# Patient Record
Sex: Male | Born: 1960 | Race: White | Hispanic: No | Marital: Married | Smoking: Never smoker
Health system: Southern US, Community
[De-identification: ages and names within clinical notes are randomized; demographics above are authoritative.]

## PROBLEM LIST (undated history)

## (undated) DIAGNOSIS — E119 Type 2 diabetes mellitus without complications: Secondary | ICD-10-CM

## (undated) DIAGNOSIS — K859 Acute pancreatitis without necrosis or infection, unspecified: Secondary | ICD-10-CM

## (undated) DIAGNOSIS — Z9889 Other specified postprocedural states: Secondary | ICD-10-CM

## (undated) DIAGNOSIS — E7800 Pure hypercholesterolemia, unspecified: Secondary | ICD-10-CM

## (undated) DIAGNOSIS — R112 Nausea with vomiting, unspecified: Secondary | ICD-10-CM

## (undated) DIAGNOSIS — Z87442 Personal history of urinary calculi: Secondary | ICD-10-CM

## (undated) DIAGNOSIS — I10 Essential (primary) hypertension: Secondary | ICD-10-CM

## (undated) DIAGNOSIS — K219 Gastro-esophageal reflux disease without esophagitis: Secondary | ICD-10-CM

## (undated) HISTORY — PX: AMPUTATION: SHX166

## (undated) HISTORY — PX: OTHER SURGICAL HISTORY: SHX169

---

## 2008-02-05 ENCOUNTER — Emergency Department (HOSPITAL_BASED_OUTPATIENT_CLINIC_OR_DEPARTMENT_OTHER): Admission: EM | Admit: 2008-02-05 | Discharge: 2008-02-05 | Payer: Self-pay | Admitting: Emergency Medicine

## 2011-04-20 LAB — DIFFERENTIAL
Basophils Absolute: 0.1
Lymphs Abs: 0.8
Monocytes Absolute: 1.3 — ABNORMAL HIGH

## 2011-04-20 LAB — BASIC METABOLIC PANEL
BUN: 8
Chloride: 105
GFR calc non Af Amer: >60
Glucose, Bld: 138 — ABNORMAL HIGH
Potassium: 4.4

## 2011-04-20 LAB — CBC
HCT: 47.1
MCV: 90.7
Platelets: DECREASED
RDW: 12.7

## 2011-07-24 DIAGNOSIS — Z87442 Personal history of urinary calculi: Secondary | ICD-10-CM

## 2011-07-24 HISTORY — DX: Personal history of urinary calculi: Z87.442

## 2011-07-24 HISTORY — PX: EXTRACORPOREAL SHOCK WAVE LITHOTRIPSY: SHX1557

## 2013-11-20 HISTORY — PX: OTHER SURGICAL HISTORY: SHX169

## 2015-12-28 DIAGNOSIS — E1140 Type 2 diabetes mellitus with diabetic neuropathy, unspecified: Secondary | ICD-10-CM | POA: Insufficient documentation

## 2015-12-28 DIAGNOSIS — E1169 Type 2 diabetes mellitus with other specified complication: Secondary | ICD-10-CM | POA: Insufficient documentation

## 2016-01-06 DIAGNOSIS — K610 Anal abscess: Secondary | ICD-10-CM | POA: Insufficient documentation

## 2016-02-08 ENCOUNTER — Other Ambulatory Visit: Payer: Self-pay | Admitting: Gastroenterology

## 2016-02-17 ENCOUNTER — Encounter (HOSPITAL_COMMUNITY): Payer: Self-pay | Admitting: *Deleted

## 2016-02-20 ENCOUNTER — Ambulatory Visit (HOSPITAL_COMMUNITY): Payer: 59 | Admitting: Anesthesiology

## 2016-02-20 ENCOUNTER — Ambulatory Visit (HOSPITAL_COMMUNITY)
Admission: RE | Admit: 2016-02-20 | Discharge: 2016-02-20 | Disposition: A | Discharge status: Home / Self Care | Payer: 59 | Source: Ambulatory Visit | Attending: Gastroenterology | Admitting: Gastroenterology

## 2016-02-20 ENCOUNTER — Encounter (HOSPITAL_COMMUNITY): Payer: Self-pay | Admitting: *Deleted

## 2016-02-20 ENCOUNTER — Encounter (HOSPITAL_COMMUNITY)
Admission: RE | Disposition: A | Discharge status: Home / Self Care | Payer: Self-pay | Source: Ambulatory Visit | Attending: Gastroenterology

## 2016-02-20 DIAGNOSIS — Z794 Long term (current) use of insulin: Secondary | ICD-10-CM | POA: Insufficient documentation

## 2016-02-20 DIAGNOSIS — Z79899 Other long term (current) drug therapy: Secondary | ICD-10-CM | POA: Insufficient documentation

## 2016-02-20 DIAGNOSIS — D125 Benign neoplasm of sigmoid colon: Secondary | ICD-10-CM | POA: Diagnosis not present

## 2016-02-20 DIAGNOSIS — E7800 Pure hypercholesterolemia, unspecified: Secondary | ICD-10-CM | POA: Diagnosis not present

## 2016-02-20 DIAGNOSIS — I10 Essential (primary) hypertension: Secondary | ICD-10-CM | POA: Insufficient documentation

## 2016-02-20 DIAGNOSIS — E119 Type 2 diabetes mellitus without complications: Secondary | ICD-10-CM | POA: Insufficient documentation

## 2016-02-20 DIAGNOSIS — Z1211 Encounter for screening for malignant neoplasm of colon: Secondary | ICD-10-CM | POA: Diagnosis present

## 2016-02-20 HISTORY — DX: Essential (primary) hypertension: I10

## 2016-02-20 HISTORY — DX: Gastro-esophageal reflux disease without esophagitis: K21.9

## 2016-02-20 HISTORY — DX: Pure hypercholesterolemia, unspecified: E78.00

## 2016-02-20 HISTORY — DX: Nausea with vomiting, unspecified: R11.2

## 2016-02-20 HISTORY — DX: Type 2 diabetes mellitus without complications: E11.9

## 2016-02-20 HISTORY — DX: Other specified postprocedural states: Z98.890

## 2016-02-20 HISTORY — PX: COLONOSCOPY WITH PROPOFOL: SHX5780

## 2016-02-20 HISTORY — DX: Acute pancreatitis without necrosis or infection, unspecified: K85.90

## 2016-02-20 HISTORY — DX: Personal history of urinary calculi: Z87.442

## 2016-02-20 LAB — GLUCOSE, CAPILLARY: Glucose-Capillary: 238 mg/dL — ABNORMAL HIGH (ref 65–99)

## 2016-02-20 SURGERY — COLONOSCOPY WITH PROPOFOL
Anesthesia: Monitor Anesthesia Care

## 2016-02-20 MED ORDER — PROPOFOL 10 MG/ML IV BOLUS
INTRAVENOUS | Status: AC
  Filled 2016-02-20: qty 60

## 2016-02-20 MED ORDER — LACTATED RINGERS IV SOLN
INTRAVENOUS | Status: DC
  Administered 2016-02-20: 13:00:00 via INTRAVENOUS

## 2016-02-20 MED ORDER — LIDOCAINE HCL (CARDIAC) 20 MG/ML IV SOLN
INTRAVENOUS | Status: DC | PRN
  Administered 2016-02-20: 25 mg via INTRATRACHEAL

## 2016-02-20 MED ORDER — PROPOFOL 10 MG/ML IV BOLUS
INTRAVENOUS | Status: DC | PRN
  Administered 2016-02-20 (×2): 30 mg via INTRAVENOUS

## 2016-02-20 MED ORDER — PROPOFOL 500 MG/50ML IV EMUL
INTRAVENOUS | Status: DC | PRN
  Administered 2016-02-20: 150 ug/kg/min via INTRAVENOUS

## 2016-02-20 MED ORDER — SODIUM CHLORIDE 0.9 % IV SOLN: INTRAVENOUS | Status: DC

## 2016-02-20 SURGICAL SUPPLY — 22 items

## 2016-02-20 NOTE — Discharge Instructions (Signed)

## 2016-02-20 NOTE — Op Note (Signed)
[facility] Patient Name: Kyle [name] Procedure Date: 02/20/2016 MRN: [id number] Attending MD: Garlan Fair , MD Date of Birth: 1961-07-16 CSN: [id number] Age: 55 Admit Type: Outpatient Procedure:                Colonoscopy Indications:              Screening for colorectal malignant neoplasm Providers:                Garlan Fair, MD, Laverta Baltimore RN, RN, Alfonso Patten, Technician, Dione Booze, CRNA Referring MD:              Medicines:                Propofol per Anesthesia Complications:            No immediate complications. Estimated Blood Loss:     Estimated blood loss: none. Procedure:                Pre-Anesthesia Assessment:                           - Prior to the procedure, a History and Physical                            was performed, and patient medications and                            allergies were reviewed. The patient's tolerance of                            previous anesthesia was also reviewed. The risks                            and benefits of the procedure and the sedation                            options and risks were discussed with the patient.                            All questions were answered, and informed consent                            was obtained. Prior Anticoagulants: The patient has                            taken no previous anticoagulant or antiplatelet                            agents. ASA Grade Assessment: II - A patient with                            mild systemic disease. After reviewing the risks  and benefits, the patient was deemed in                            satisfactory condition to undergo the procedure.                           After obtaining informed consent, the colonoscope                            was passed under direct vision. Throughout the                            procedure, the patient's blood pressure, pulse, and                          oxygen saturations were monitored continuously. The                            [id number] scope was introduced through                            the anus and advanced to the the cecum, identified                            by appendiceal orifice and ileocecal valve. The                            colonoscopy was performed without difficulty. The                            patient tolerated the procedure well. The quality                            of the bowel preparation was good. The ileocecal                            valve, the appendiceal orifice and the rectum were                            photographed. Scope In: 1:10:12 PM Scope Out: 1:34:19 PM Total Procedure Duration: 0 hours 24 minutes 7 seconds  Findings:      The perianal and digital rectal examinations were normal.      A 5 mm polyp was found in the mid sigmoid colon. The polyp was sessile.       The polyp was removed with a cold snare. Resection and retrieval were       complete.      The exam was otherwise without abnormality. Impression:               - One 5 mm polyp in the mid sigmoid colon, removed                            with a cold snare. Resected and retrieved.                           -  The examination was otherwise normal. Moderate Sedation:      N/A- Per Anesthesia Care Recommendation:           - Patient has a contact number available for                            emergencies. The signs and symptoms of potential                            delayed complications were discussed with the                            patient. Return to normal activities tomorrow.                            Written discharge instructions were provided to the                            patient.                           - Repeat colonoscopy date to be determined after                            pending pathology results are reviewed for                            surveillance.                            - Resume previous diet.                           - Continue present medications. Procedure Code(s):        --- Professional ---                           [phone], Colonoscopy, flexible; with removal of                            tumor(s), polyp(s), or other lesion(s) by snare                            technique Diagnosis Code(s):        --- Professional ---                           Z12.11, Encounter for screening for malignant                            neoplasm of colon                           D12.5, Benign neoplasm of sigmoid colon CPT copyright 2016 [employer]. All rights reserved. The codes documented in this report are preliminary and upon coder review may  be revised to meet current compliance requirements. Earle Gell, MD Ursula Alert  Wynetta Emery, MD 02/20/2016 1:42:45 PM This report has been signed electronically. Number of Addenda: 0

## 2016-02-20 NOTE — H&P (Signed)
  Procedure: Baseline screening colonoscopy  History: The patient is a 55 year old male born Jan 01, 1961. He is scheduled to undergo a screening colonoscopy today  Medication allergies: None  Past medical history: Type 2 diabetes mellitus. Hypertension. Hypercholesterolemia.  Exam: The patient is alert and lying comfortably on the endoscopy stretcher. Abdomen is soft and nontender to palpation. Cardiac exam reveals a regular rhythm. Lungs are clear to auscultation.  Plan: Proceed with screening colonoscopy

## 2016-02-20 NOTE — Transfer of Care (Signed)
Immediate Anesthesia Transfer of Care Note  Patient: Kyle [name]  Procedure(s) Performed: Procedure(s): COLONOSCOPY WITH PROPOFOL (N/A)  Patient Location: PACU and Endoscopy Unit  Anesthesia Type:MAC  Level of Consciousness: awake and patient cooperative  Airway & Oxygen Therapy: Patient Spontanous Breathing and Patient connected to face mask oxygen  Post-op Assessment: Report given to RN and Post -op Vital signs reviewed and stable  Post vital signs: Reviewed and stable  Last Vitals:  Vitals:   02/20/16 1235  BP: (!) 169/105  Pulse: 89  Resp: 20  Temp: 36.8 C    Last Pain:  Vitals:   02/20/16 1235  TempSrc: Oral         Complications: No apparent anesthesia complications

## 2016-02-20 NOTE — Anesthesia Preprocedure Evaluation (Signed)
Anesthesia Evaluation  Patient identified by MRN, date of birth, ID band Patient awake    Reviewed: Allergy & Precautions, H&P , Patient's Chart, lab work & pertinent test results, reviewed documented beta blocker date and time   Airway Mallampati: II  TM Distance: >3 FB Neck ROM: full    Dental no notable dental hx.    Pulmonary    Pulmonary exam normal breath sounds clear to auscultation       Cardiovascular hypertension, On Medications  Rhythm:regular Rate:Normal     Neuro/Psych    GI/Hepatic GERD  ,  Endo/Other  diabetes, Insulin Dependent  Renal/GU      Musculoskeletal   Abdominal   Peds  Hematology   Anesthesia Other Findings   Reproductive/Obstetrics                             Anesthesia Physical Anesthesia Plan  ASA: II  Anesthesia Plan: MAC   Post-op Pain Management:    Induction: Intravenous  Airway Management Planned: Mask and Natural Airway  Additional Equipment:   Intra-op Plan:   Post-operative Plan:   Informed Consent: I have reviewed the patients History and Physical, chart, labs and discussed the procedure including the risks, benefits and alternatives for the proposed anesthesia with the patient or authorized representative who has indicated his/her understanding and acceptance.   Dental Advisory Given  Plan Discussed with: CRNA and Surgeon  Anesthesia Plan Comments: (Discussed sedation and potential to need to place airway or ETT if warranted by clinical changes intra-operatively. We will start procedure as MAC.)        Anesthesia Quick Evaluation

## 2016-02-21 ENCOUNTER — Encounter (HOSPITAL_COMMUNITY): Payer: Self-pay | Admitting: Gastroenterology

## 2016-02-23 NOTE — Anesthesia Postprocedure Evaluation (Signed)
Anesthesia Post Note  Patient: Kyle [name]  Procedure(s) Performed: Procedure(s) (LRB): COLONOSCOPY WITH PROPOFOL (N/A)  Patient location during evaluation: PACU Anesthesia Type: MAC Level of consciousness: awake and alert Pain management: pain level controlled Vital Signs Assessment: post-procedure vital signs reviewed and stable Respiratory status: spontaneous breathing, nonlabored ventilation, respiratory function stable and patient connected to nasal cannula oxygen Cardiovascular status: stable and blood pressure returned to baseline Anesthetic complications: no    Last Vitals:  Vitals:   02/20/16 1340 02/20/16 1400  BP: (!) 166/101 (!) 186/115  Pulse:    Resp:    Temp: 36.6 C     Last Pain:  Vitals:   02/20/16 1340  TempSrc: Oral                 Treson Laura EDWARD

## 2016-04-24 ENCOUNTER — Other Ambulatory Visit: Payer: Self-pay | Admitting: Physician Assistant

## 2016-04-24 DIAGNOSIS — E113591 Type 2 diabetes mellitus with proliferative diabetic retinopathy without macular edema, right eye: Secondary | ICD-10-CM

## 2016-04-24 DIAGNOSIS — H2013 Chronic iridocyclitis, bilateral: Secondary | ICD-10-CM

## 2016-05-03 ENCOUNTER — Ambulatory Visit
Admission: RE | Admit: 2016-05-03 | Discharge: 2016-05-03 | Disposition: A | Discharge status: Home / Self Care | Payer: 59 | Source: Ambulatory Visit | Attending: Physician Assistant | Admitting: Physician Assistant

## 2016-05-03 DIAGNOSIS — H2013 Chronic iridocyclitis, bilateral: Secondary | ICD-10-CM

## 2016-05-03 DIAGNOSIS — E113591 Type 2 diabetes mellitus with proliferative diabetic retinopathy without macular edema, right eye: Secondary | ICD-10-CM

## 2018-01-13 ENCOUNTER — Encounter (HOSPITAL_COMMUNITY): Payer: Self-pay

## 2018-01-13 ENCOUNTER — Emergency Department (HOSPITAL_COMMUNITY)
Admission: EM | Admit: 2018-01-13 | Discharge: 2018-01-13 | Disposition: A | Discharge status: Home / Self Care | Payer: 59 | Attending: Emergency Medicine | Admitting: Emergency Medicine

## 2018-01-13 DIAGNOSIS — I10 Essential (primary) hypertension: Secondary | ICD-10-CM | POA: Insufficient documentation

## 2018-01-13 DIAGNOSIS — Z794 Long term (current) use of insulin: Secondary | ICD-10-CM | POA: Diagnosis not present

## 2018-01-13 DIAGNOSIS — Z79899 Other long term (current) drug therapy: Secondary | ICD-10-CM | POA: Insufficient documentation

## 2018-01-13 DIAGNOSIS — M5432 Sciatica, left side: Secondary | ICD-10-CM

## 2018-01-13 DIAGNOSIS — F17290 Nicotine dependence, other tobacco product, uncomplicated: Secondary | ICD-10-CM | POA: Insufficient documentation

## 2018-01-13 DIAGNOSIS — E119 Type 2 diabetes mellitus without complications: Secondary | ICD-10-CM | POA: Insufficient documentation

## 2018-01-13 LAB — CBG MONITORING, ED: Glucose-Capillary: 378 mg/dL — ABNORMAL HIGH (ref 65–99)

## 2018-01-13 MED ORDER — HYDROMORPHONE HCL 1 MG/ML IJ SOLN
2.0000 mg | Freq: Once | INTRAMUSCULAR | Status: AC
  Administered 2018-01-13: 2 mg via INTRAMUSCULAR
  Filled 2018-01-13: qty 2

## 2018-01-13 MED ORDER — METHOCARBAMOL 500 MG PO TABS: 500.0000 mg | ORAL_TABLET | Freq: Two times a day (BID) | ORAL | 0 refills | Status: DC

## 2018-01-13 MED ORDER — NAPROXEN 500 MG PO TABS: 500.0000 mg | ORAL_TABLET | Freq: Two times a day (BID) | ORAL | 0 refills | Status: DC

## 2018-01-13 MED ORDER — METHOCARBAMOL 500 MG PO TABS
750.0000 mg | ORAL_TABLET | Freq: Once | ORAL | Status: AC
Start: 2018-01-13 — End: 2018-01-13
  Administered 2018-01-13: 750 mg via ORAL
  Filled 2018-01-13: qty 2

## 2018-01-13 MED ORDER — IBUPROFEN 800 MG PO TABS
800.0000 mg | ORAL_TABLET | Freq: Once | ORAL | Status: AC
  Administered 2018-01-13: 800 mg via ORAL
  Filled 2018-01-13: qty 1

## 2018-01-13 MED ORDER — DIAZEPAM 5 MG/ML IJ SOLN
5.0000 mg | Freq: Once | INTRAMUSCULAR | Status: AC
  Administered 2018-01-13: 5 mg via INTRAVENOUS
  Filled 2018-01-13: qty 2

## 2018-01-13 MED ORDER — LIDOCAINE 5 % EX PTCH: 1.0000 | MEDICATED_PATCH | CUTANEOUS | 0 refills | Status: DC

## 2018-01-13 NOTE — ED Triage Notes (Signed)
Pt states that he has a pinched nerve in his back and that he had pain shooting down his L leg, reports that he has been moving all weekend and lifting furniture.

## 2018-01-13 NOTE — Discharge Instructions (Addendum)
You were seen here today for Back Pain: Low back pain is discomfort in the lower back that may be due to injuries to muscles and ligaments around the spine. Occasionally, it may be caused by a problem to a part of the spine called a disc. Your back pain should be treated with medicines listed below as well as back exercises and this back pain should get better over the next 2 weeks. Most patients get completely well in 4 weeks. It is important to know however, if you develop severe or worsening pain, low back pain with fever, numbness, weakness or inability to walk or urinate, you should return to the ER immediately.  Please follow up with your doctor this week for a recheck if still having symptoms.  HOME INSTRUCTIONS Self - care:  The application of heat can help soothe the pain.  Maintaining your daily activities, including walking (this is encouraged), as it will help you get better faster than just staying in bed. Do not life, push, pull anything more than 10 pounds for the next week. I am attaching back exercises that you can do at home to help facilitate your recovery.   Back Exercises - I have attached a handout on back exercises that can be done at home to help facilitate your recovery.   Medications are also useful to help with pain control.   Acetaminophen.  This medication is generally safe, and found over the counter. Take as directed for your age. You should not take more than 8 of the extra strength (500mg ) pills a day (max dose is 4000mg  total OVER one day)  Non steroidal anti inflammatory: This includes medications including Ibuprofen, naproxen and Mobic; These medications help both pain and swelling and are very useful in treating back pain.  They should be taken with food, as they can cause stomach upset, and more seriously, stomach bleeding. Do not combine the medications.   Muscle relaxants:  These medications can help with muscle tightness that is a cause of lower back pain.  Most  of these medications can cause drowsiness, and it is not safe to drive or use dangerous machinery while taking them. They are primarily helpful when taken at night before sleep.  Prednisone - We are holding off on prednisone secondary to your diabetes.   You will need to follow up with your primary healthcare provider or the Orthopedist in 1-2 weeks for reassessment and persistent symptoms.  Be aware that if you develop new symptoms, such as a fever, leg weakness, difficulty with or loss of control of your urine or bowels, abdominal pain, or more severe pain, you will need to seek medical attention and/or return to the Emergency department. Additional Information:  Your vital signs today were: BP (!) 148/91    Pulse 98    Temp 98.3 F (36.8 C) (Oral)    Resp 18    SpO2 98%  If your blood pressure (BP) was elevated above 135/85 this visit, please have this repeated by your doctor within one month. ---------------

## 2018-01-13 NOTE — ED Provider Notes (Signed)
[facility] EMERGENCY DEPARTMENT Provider Note   CSN: [id number] Arrival date & time: 01/13/18  0357     History   Chief Complaint Chief Complaint  Patient presents with  . Sciatica    HPI Kyle [name] is a 57 y.o. male.  Patient presents with low back pain that radiates down the left posterior leg. He states there is numbness as well. No urinary/bowel dysfunction. He and his wife moved furniture all weekend and he feels this caused his severe back pain. No history of chronic pain.   The history is provided by the patient. No language interpreter was used.    Past Medical History:  Diagnosis Date  . Diabetes mellitus without complication ([place])   . Elevated cholesterol   . GERD (gastroesophageal reflux disease)   . History of kidney stones 2013  . Hypertension   . Pancreatitis age 14   with mumps, no problems since  . PONV (postoperative nausea and vomiting) yrs ago    There are no active problems to display for this patient.   Past Surgical History:  Procedure Laterality Date  . ankle surgery for fx Right 24 yrs ago  . COLONOSCOPY WITH PROPOFOL N/A 02/20/2016   Procedure: COLONOSCOPY WITH PROPOFOL;  Surgeon: Garlan Fair, MD;  Location: [facility] ENDOSCOPY;  Service: Endoscopy;  Laterality: N/A;  . cyst removed between legs  11/2013  . EXTRACORPOREAL SHOCK WAVE LITHOTRIPSY  2013        Home Medications    Prior to Admission medications   Medication Sig Start Date End Date Taking? Authorizing Provider  amLODipine (NORVASC) 5 MG tablet Take 5 mg by mouth daily. 01/24/16   [provider]  benazepril-hydrochlorthiazide (LOTENSIN HCT) 20-25 MG tablet Take 1 tablet by mouth daily. 12/14/15   [provider]  cholecalciferol (VITAMIN D) 1000 units tablet Take 1,000 Units by mouth daily.    [provider]  cyanocobalamin (V-R VITAMIN B-12) 500 MCG tablet Take 500 mcg by mouth daily.    [provider]    gabapentin (NEURONTIN) 300 MG capsule Take 300 mg by mouth 3 (three) times daily. 12/14/15   [provider]  Insulin Detemir (LEVEMIR FLEXTOUCH) 100 UNIT/ML Pen Inject 10 Units into the skin daily. 02/10/16   [provider]  metFORMIN (GLUCOPHAGE-XR) 500 MG 24 hr tablet Take 500 mg by mouth 2 (two) times daily. 12/28/15   [provider]  ONGLYZA 2.5 MG TABS tablet Take 2.5 mg by mouth daily. 01/30/16   [provider]  RA KRILL OIL 500 MG CAPS Take 500 mg by mouth daily.    [provider]  simvastatin (ZOCOR) 20 MG tablet Take 20 mg by mouth daily. 01/24/16   [provider]  vitamin C (ASCORBIC ACID) 500 MG tablet Take 500 mg by mouth daily.    [provider]  Vitamins/Minerals TABS Take 1 tablet by mouth daily.    [provider]    Family History No family history on file.  Social History Social History   Tobacco Use  . Smoking status: Never Smoker  . Smokeless tobacco: Current User    Types: Snuff  Substance Use Topics  . Alcohol use: No  . Drug use: No     Allergies   Hydrocodone; Penicillins; and Oxycodone   Review of Systems Review of Systems  Constitutional: Negative for diaphoresis.  Gastrointestinal: Negative.  Negative for abdominal pain.  Genitourinary: Negative for enuresis.  Musculoskeletal: Positive for back pain.  See HPI  Skin: Negative.  Negative for color change.  Neurological: Positive for numbness.     Physical Exam Updated Vital Signs BP (!) 154/91   Pulse 99   Temp 98.3 F (36.8 C) (Oral)   Resp 20   SpO2 95%   Physical Exam  Constitutional: He is oriented to person, place, and time. He appears well-developed and well-nourished.  Neck: Normal range of motion.  Pulmonary/Chest: Effort normal.  Abdominal: Soft. He exhibits no mass. There is no tenderness.  Musculoskeletal: Normal range of motion.  Right paralumbar tenderness without swelling, discoloration. No  sciatic tenderness on right. Distal pulses 2+.  Neurological: He is alert and oriented to person, place, and time. He has normal reflexes. No sensory deficit (Patient has symmetric sensory deficit to lower legs due to diabetic neuropathy.).  Skin: Skin is warm and dry.  Psychiatric: He has a normal mood and affect.     ED Treatments / Results  Labs (all labs ordered are listed, but only abnormal results are displayed) Labs Reviewed - No data to display  EKG None  Radiology No results found.  Procedures Procedures (including critical care time)  Medications Ordered in ED Medications  HYDROmorphone (DILAUDID) injection 2 mg (has no administration in time range)  methocarbamol (ROBAXIN) tablet 750 mg (has no administration in time range)  ibuprofen (ADVIL,MOTRIN) tablet 800 mg (has no administration in time range)     Initial Impression / Assessment and Plan / ED Course  I have reviewed the triage vital signs and the nursing notes.  Pertinent labs & imaging results that were available during my care of the patient were reviewed by me and considered in my medical decision making (see chart for details).     Patient presents with severe low left back pain radiating to left leg posteriorly since lifting furniture over the weekend, becoming progressively worse.   Presentation consistent with sciatica. No neurologic deficits. Equal LE reflexes that are symmetric.   IM Dilaudid, PO robaxin and ibuprofen provided with very little relief. Will start IV and medicate pain further. Goal is improvement, not resolution and this is understood by the patient and wife.   Patient care signed out to [facility], PA-C, for final re-evaluation and disposition. Anticipate discharge home.   Final Clinical Impressions(s) / ED Diagnoses   Final diagnoses:  None   1. Left sciatica  ED Discharge Orders    None       Dennie Bible 01/13/18 0617    Fatima Blank,  MD 01/13/18 [phone]

## 2018-01-13 NOTE — ED Provider Notes (Addendum)
Care assumed from Charlann Lange, PA-C at shift change with reassessment pending,.  Please see her note for further details  In brief, this patient is a 57 year old male that presents with left low back pain that radiates down his posterior leg that is consistent with sciatica.  Patient reports that he has been moving furniture all weekend that exacerbated this.  He denies bowel/bladder incontinence, urinary retention, new numbness or weakness of the lower extremities or difficulty with gait.  No trauma  PLAN: Reassess after Valium  MDM:  On exam patient is with paraspinal tenderness on exam.  He is neurovascular intact distally.  Compartments are soft.  He has intact and equal deep tendon reflexes bilaterally.  He is able and normal gait.  Normal neurologic exam. No abdominal TTP.  He denies any bowel/bladder incontinence, urinary retention or saddle anesthesia.  No concern for cauda equina.  Patient CBG to elevated (>250) to give Decadron in the department. Will hold off on steroids.  Patient is reportedly already on max dose of gabapentin.  Will treat with muscle relaxers, anti-inflammatory medication, activity modification, back exercises, lidocaine patches and follow-up with orthopedics. Patient states pain has greatly improved.   I advised the patient to follow-up with orthopedics this week. Specific return precautions discussed. Time was given for all questions to be answered. The patient verbalized understanding and agreement with plan. The patient appears safe for discharge home.  1. Left sided sciatica       Jillyn Ledger, PA-C 01/13/18 [id number]    Jillyn Ledger, PA-C 01/13/18 0813    Fatima Blank, MD 01/14/18 [phone]

## 2018-03-09 IMAGING — US US CAROTID DUPLEX BILAT
1 series · 13 of 24 positions shown · non-contrast
Comparison: None.

CLINICAL DATA: Visual disturbance. Diabetic retinopathy,
bilaterally. Hypertension, hyperlipidemia, diabetes, previous
tobacco abuse.

EXAM:
BILATERAL CAROTID DUPLEX ULTRASOUND
TECHNIQUE: Gray scale imaging, color Doppler and duplex ultrasound was
performed of bilateral carotid and vertebral arteries in the neck.
TECHNIQUE: Quantification of carotid stenosis is based on velocity parameters
that correlate the residual internal carotid diameter with
NASCET-based stenosis levels, using the diameter of the distal
internal carotid lumen as the denominator for stenosis measurement.

[Series 1: us carotid duplex bilat · 0.08mm/px · 13 of 88 slices shown]
[im 1/88]
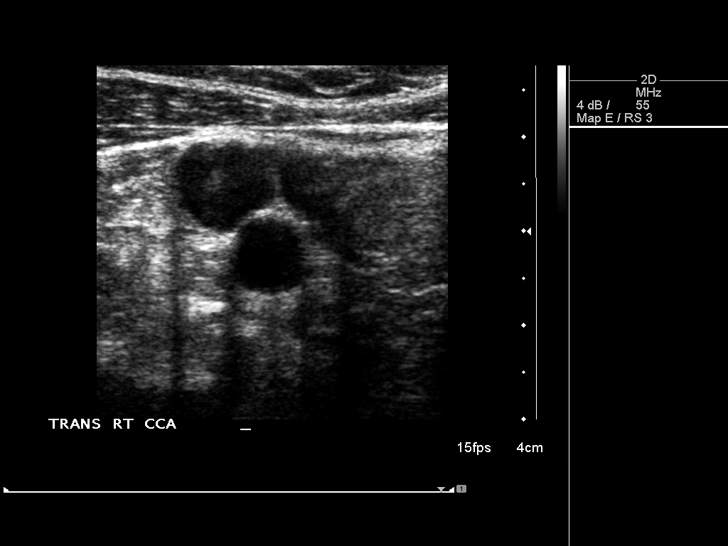
[im 8/88]
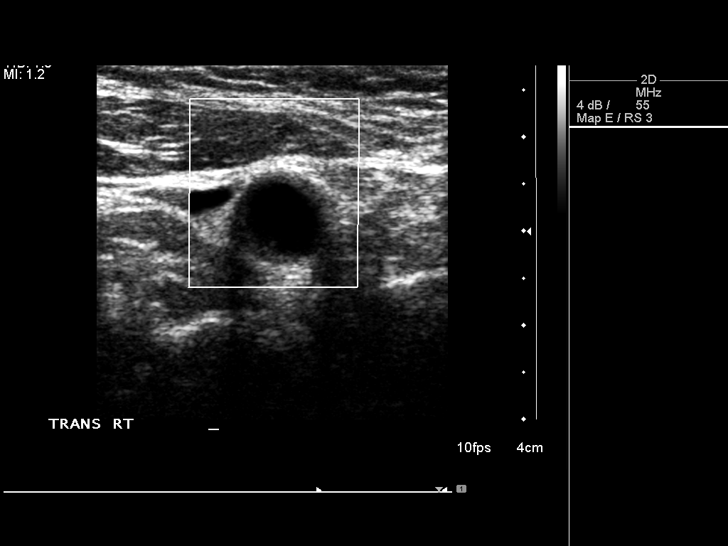
[im 16/88]
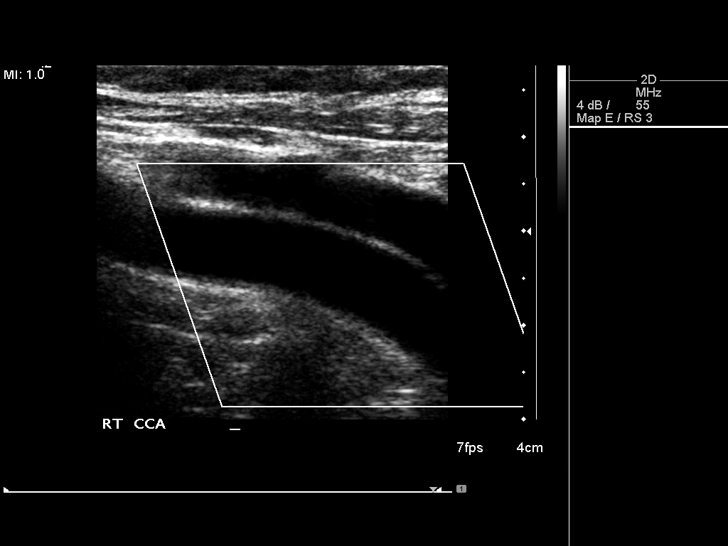
[im 23/88]
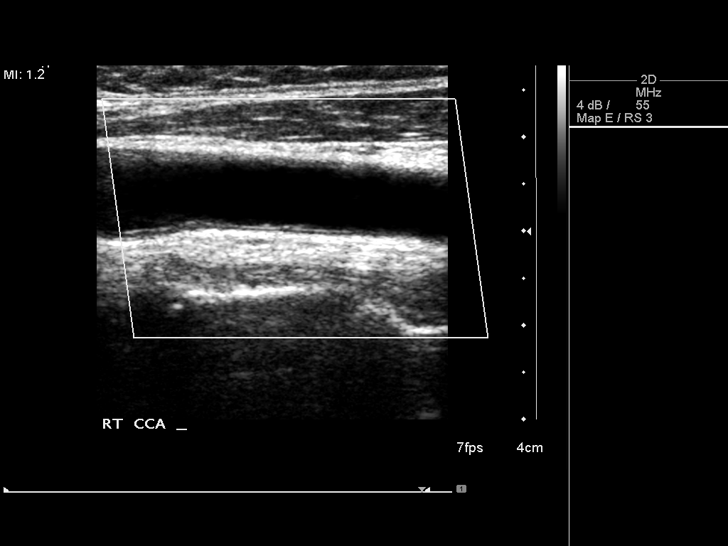
[im 31/88]
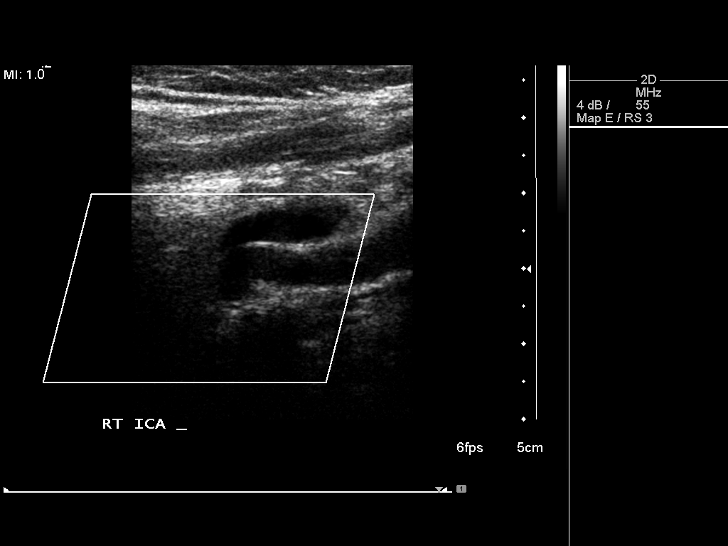
[im 38/88]
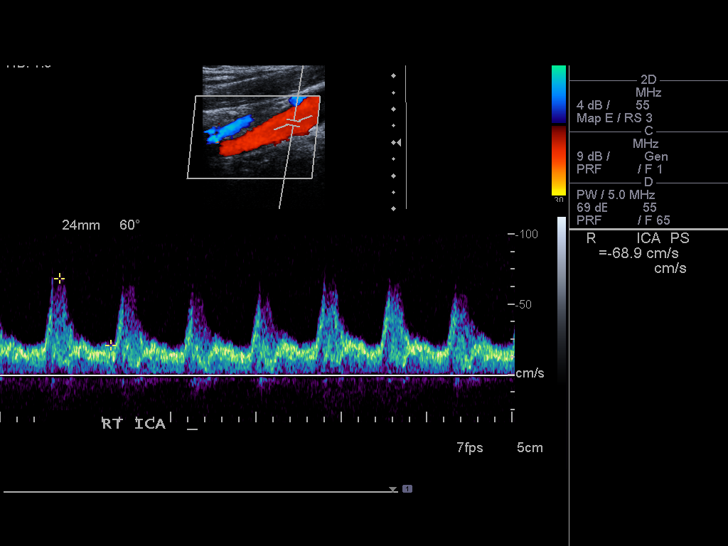
[im 46/88]
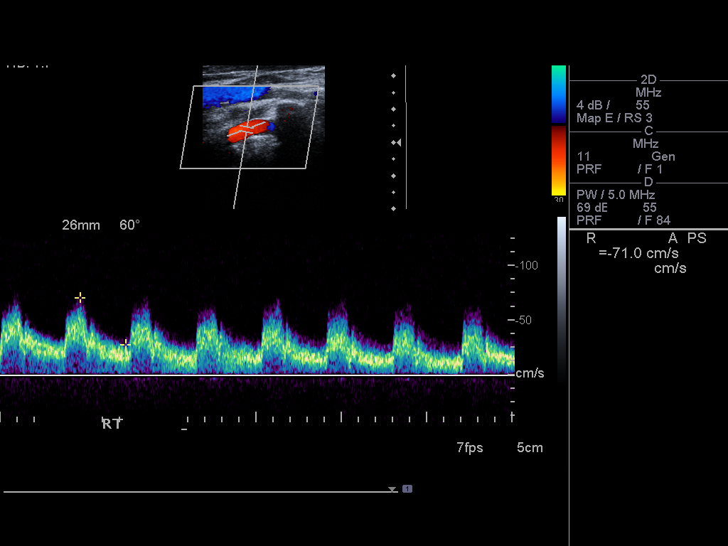
[im 50/88]
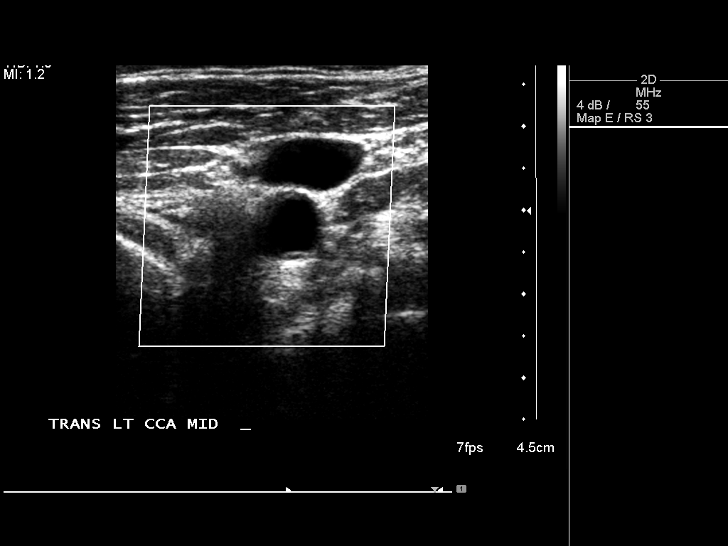
[im 57/88]
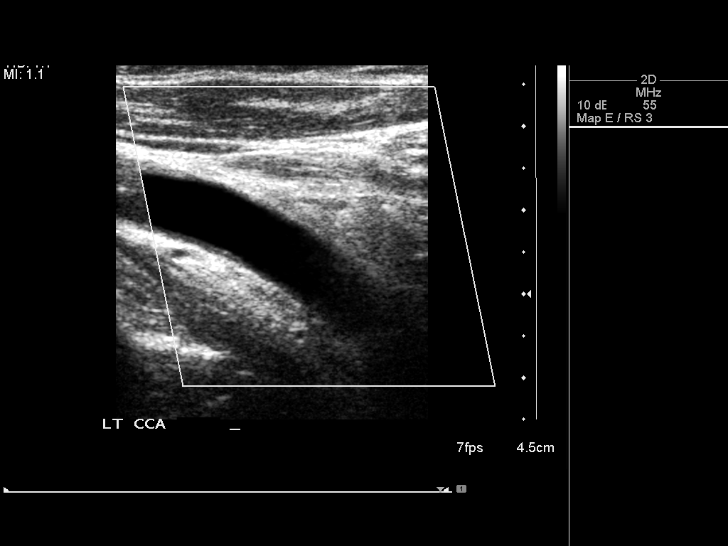
[im 65/88]
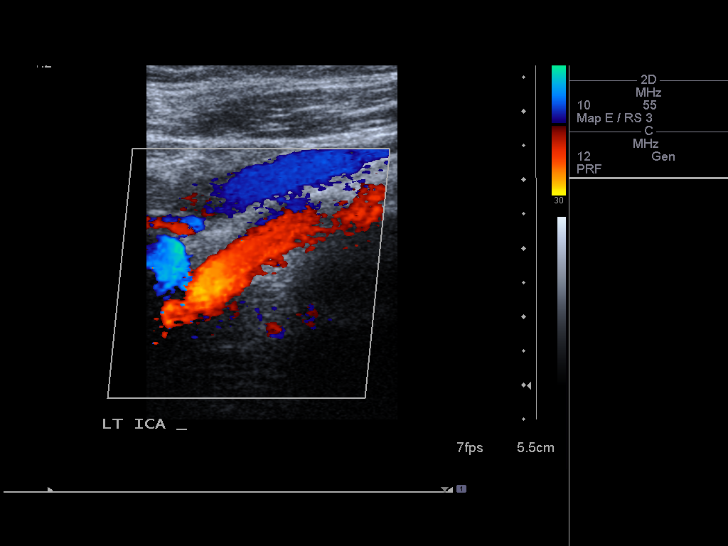
[im 72/88]
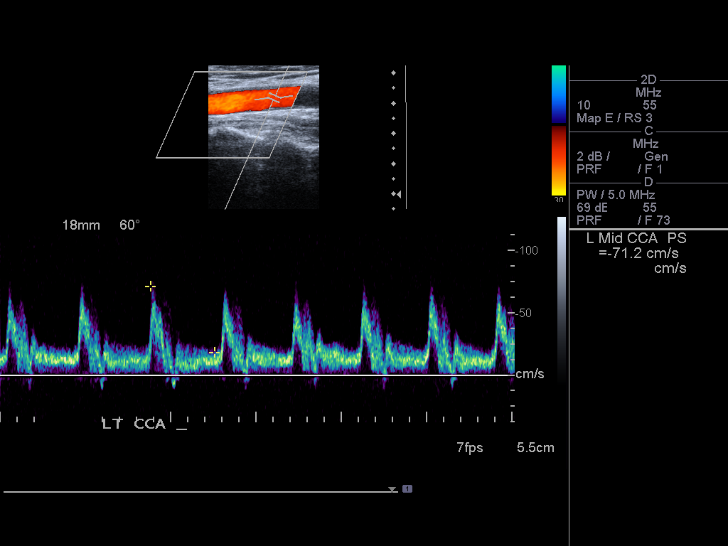
[im 80/88]
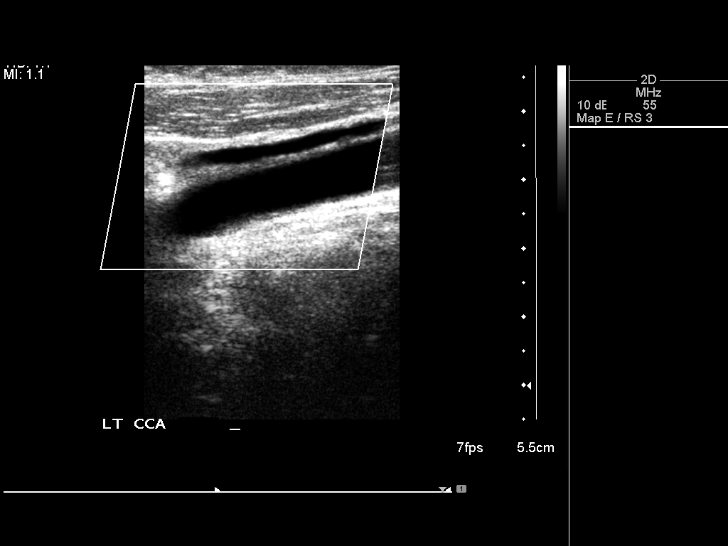
[im 88/88]
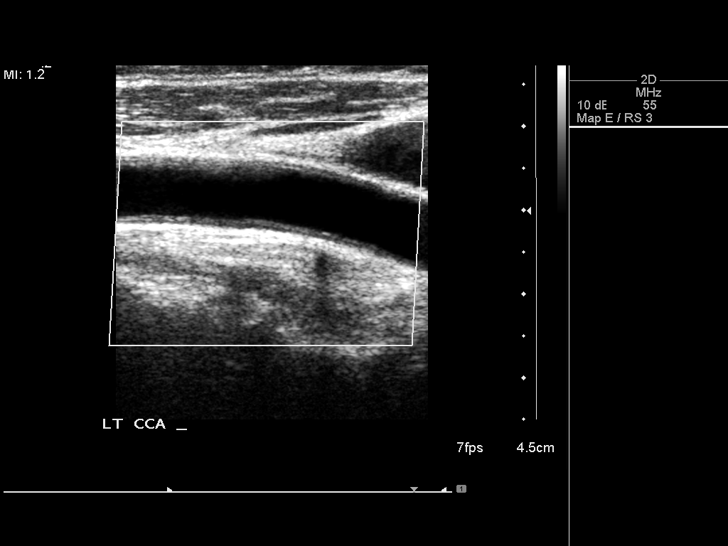

[13 of 24 positions shown; findings below may reference images not displayed]

The following velocity measurements were obtained:

PEAK SYSTOLIC/END DIASTOLIC

RIGHT

ICA:                     92/41cm/sec

CCA:                     107/16cm/sec

SYSTOLIC ICA/CCA RATIO:

DIASTOLIC ICA/CCA RATIO:

ECA:                     96/24cm/sec

LEFT

ICA:                     70/31cm/sec

CCA:                     90/12cm/sec

SYSTOLIC ICA/CCA RATIO:

DIASTOLIC ICA/CCA RATIO:

ECA:                     83/12cm/sec
FINDINGS: RIGHT CAROTID ARTERY: No significant plaque or stenosis. Normal
waveforms and color Doppler signal. Distal ICA mildly tortuous.

RIGHT VERTEBRAL ARTERY:  Normal flow direction and waveform.

LEFT CAROTID ARTERY: Mild intimal thickening in the bulb with some
smooth nonocclusive plaque. Normal waveforms and color Doppler
signal.

LEFT VERTEBRAL ARTERY: Normal flow direction and waveform.
IMPRESSION: 1. Mild left carotid bifurcation plaque resulting in less than 50%
diameter stenosis. The exam does not exclude plaque ulceration or
embolization. Continued surveillance recommended.
2.  Antegrade bilateral vertebral arterial flow.

## 2019-02-19 DIAGNOSIS — H25811 Combined forms of age-related cataract, right eye: Secondary | ICD-10-CM | POA: Insufficient documentation

## 2019-05-18 ENCOUNTER — Encounter (HOSPITAL_COMMUNITY): Payer: Self-pay | Admitting: Internal Medicine

## 2019-05-18 ENCOUNTER — Inpatient Hospital Stay (HOSPITAL_COMMUNITY)
Admission: EM | Admit: 2019-05-18 | Discharge: 2019-05-20 | DRG: 177 | Discharge status: Against Medical Advice | Payer: Managed Care, Other (non HMO) | Attending: Internal Medicine | Admitting: Internal Medicine

## 2019-05-18 ENCOUNTER — Other Ambulatory Visit: Payer: Self-pay

## 2019-05-18 ENCOUNTER — Emergency Department (HOSPITAL_COMMUNITY): Payer: Managed Care, Other (non HMO)

## 2019-05-18 DIAGNOSIS — K219 Gastro-esophageal reflux disease without esophagitis: Secondary | ICD-10-CM | POA: Diagnosis present

## 2019-05-18 DIAGNOSIS — U071 COVID-19: Principal | ICD-10-CM | POA: Diagnosis present

## 2019-05-18 DIAGNOSIS — E876 Hypokalemia: Secondary | ICD-10-CM | POA: Diagnosis present

## 2019-05-18 DIAGNOSIS — Z720 Tobacco use: Secondary | ICD-10-CM | POA: Diagnosis not present

## 2019-05-18 DIAGNOSIS — Z20822 Contact with and (suspected) exposure to covid-19: Secondary | ICD-10-CM

## 2019-05-18 DIAGNOSIS — R197 Diarrhea, unspecified: Secondary | ICD-10-CM | POA: Diagnosis present

## 2019-05-18 DIAGNOSIS — Z716 Tobacco abuse counseling: Secondary | ICD-10-CM | POA: Diagnosis not present

## 2019-05-18 DIAGNOSIS — E1165 Type 2 diabetes mellitus with hyperglycemia: Secondary | ICD-10-CM | POA: Diagnosis not present

## 2019-05-18 DIAGNOSIS — J1289 Other viral pneumonia: Secondary | ICD-10-CM | POA: Diagnosis present

## 2019-05-18 DIAGNOSIS — E785 Hyperlipidemia, unspecified: Secondary | ICD-10-CM | POA: Diagnosis present

## 2019-05-18 DIAGNOSIS — Z79899 Other long term (current) drug therapy: Secondary | ICD-10-CM | POA: Diagnosis not present

## 2019-05-18 DIAGNOSIS — I10 Essential (primary) hypertension: Secondary | ICD-10-CM | POA: Diagnosis present

## 2019-05-18 DIAGNOSIS — R0602 Shortness of breath: Secondary | ICD-10-CM

## 2019-05-18 DIAGNOSIS — Z794 Long term (current) use of insulin: Secondary | ICD-10-CM

## 2019-05-18 DIAGNOSIS — IMO0002 Reserved for concepts with insufficient information to code with codable children: Secondary | ICD-10-CM | POA: Diagnosis present

## 2019-05-18 DIAGNOSIS — Z6841 Body Mass Index (BMI) 40.0 and over, adult: Secondary | ICD-10-CM | POA: Diagnosis not present

## 2019-05-18 DIAGNOSIS — Z885 Allergy status to narcotic agent status: Secondary | ICD-10-CM | POA: Diagnosis not present

## 2019-05-18 DIAGNOSIS — Z20828 Contact with and (suspected) exposure to other viral communicable diseases: Secondary | ICD-10-CM

## 2019-05-18 DIAGNOSIS — E1149 Type 2 diabetes mellitus with other diabetic neurological complication: Secondary | ICD-10-CM | POA: Diagnosis present

## 2019-05-18 DIAGNOSIS — E1140 Type 2 diabetes mellitus with diabetic neuropathy, unspecified: Secondary | ICD-10-CM | POA: Diagnosis present

## 2019-05-18 DIAGNOSIS — Z791 Long term (current) use of non-steroidal anti-inflammatories (NSAID): Secondary | ICD-10-CM

## 2019-05-18 DIAGNOSIS — J9601 Acute respiratory failure with hypoxia: Secondary | ICD-10-CM | POA: Diagnosis present

## 2019-05-18 DIAGNOSIS — T380X5A Adverse effect of glucocorticoids and synthetic analogues, initial encounter: Secondary | ICD-10-CM | POA: Diagnosis not present

## 2019-05-18 DIAGNOSIS — J9691 Respiratory failure, unspecified with hypoxia: Secondary | ICD-10-CM | POA: Diagnosis present

## 2019-05-18 DIAGNOSIS — Z880 Allergy status to penicillin: Secondary | ICD-10-CM

## 2019-05-18 LAB — CBC WITH DIFFERENTIAL/PLATELET
Abs Immature Granulocytes: 0.04 10*3/uL (ref 0.00–0.07)
Basophils Absolute: 0 10*3/uL (ref 0.0–0.1)
Basophils Relative: 0 %
Eosinophils Absolute: 0 10*3/uL (ref 0.0–0.5)
Eosinophils Relative: 0 %
HCT: 50.4 % (ref 39.0–52.0)
Hemoglobin: 17.3 g/dL — ABNORMAL HIGH (ref 13.0–17.0)
Immature Granulocytes: 1 %
Lymphocytes Relative: 13 %
Lymphs Abs: 1 10*3/uL (ref 0.7–4.0)
MCH: 30.9 pg (ref 26.0–34.0)
MCHC: 34.3 g/dL (ref 30.0–36.0)
MCV: 90 fL (ref 80.0–100.0)
Monocytes Absolute: 0.6 10*3/uL (ref 0.1–1.0)
Monocytes Relative: 8 %
Neutro Abs: 6 10*3/uL (ref 1.7–7.7)
Neutrophils Relative %: 78 %
Platelets: 234 10*3/uL (ref 150–400)
RBC: 5.6 MIL/uL (ref 4.22–5.81)
RDW: 12.6 % (ref 11.5–15.5)
WBC: 7.7 10*3/uL (ref 4.0–10.5)
nRBC: 0 % (ref 0.0–0.2)

## 2019-05-18 LAB — CBG MONITORING, ED
Glucose-Capillary: 231 mg/dL — ABNORMAL HIGH (ref 70–99)
Glucose-Capillary: 304 mg/dL — ABNORMAL HIGH (ref 70–99)
Glucose-Capillary: 310 mg/dL — ABNORMAL HIGH (ref 70–99)
Glucose-Capillary: 345 mg/dL — ABNORMAL HIGH (ref 70–99)

## 2019-05-18 LAB — COMPREHENSIVE METABOLIC PANEL
ALT: 29 U/L (ref 0–44)
AST: 29 U/L (ref 15–41)
Albumin: 3.4 g/dL — ABNORMAL LOW (ref 3.5–5.0)
Alkaline Phosphatase: 63 U/L (ref 38–126)
Anion gap: 14 (ref 5–15)
BUN: 16 mg/dL (ref 6–20)
CO2: 26 mmol/L (ref 22–32)
Calcium: 9.1 mg/dL (ref 8.9–10.3)
Chloride: 98 mmol/L (ref 98–111)
Creatinine, Ser: 1.02 mg/dL (ref 0.61–1.24)
GFR calc Af Amer: >60 mL/min (ref >60)
GFR calc non Af Amer: >60 mL/min (ref >60)
Glucose, Bld: 250 mg/dL — ABNORMAL HIGH (ref 70–99)
Potassium: 3.2 mmol/L — ABNORMAL LOW (ref 3.5–5.1)
Sodium: 138 mmol/L (ref 135–145)
Total Bilirubin: 0.9 mg/dL (ref 0.3–1.2)
Total Protein: 7.1 g/dL (ref 6.5–8.1)

## 2019-05-18 LAB — GLUCOSE, CAPILLARY: Glucose-Capillary: 330 mg/dL — ABNORMAL HIGH (ref 70–99)

## 2019-05-18 LAB — FIBRINOGEN: Fibrinogen: 547 mg/dL — ABNORMAL HIGH (ref 210–475)

## 2019-05-18 LAB — ABO/RH: ABO/RH(D): O POS

## 2019-05-18 LAB — D-DIMER, QUANTITATIVE: D-Dimer, Quant: 0.69 ug/mL-FEU — ABNORMAL HIGH (ref 0.00–0.50)

## 2019-05-18 LAB — PROCALCITONIN: Procalcitonin: <0.1 ng/mL

## 2019-05-18 LAB — BRAIN NATRIURETIC PEPTIDE: B Natriuretic Peptide: 56.2 pg/mL (ref 0.0–100.0)

## 2019-05-18 LAB — TROPONIN I (HIGH SENSITIVITY): Troponin I (High Sensitivity): 25 ng/L — ABNORMAL HIGH (ref <18)

## 2019-05-18 LAB — C-REACTIVE PROTEIN: CRP: 3.6 mg/dL — ABNORMAL HIGH (ref <1.0)

## 2019-05-18 LAB — SARS CORONAVIRUS 2 (TAT 6-24 HRS): SARS Coronavirus 2: POSITIVE — AB

## 2019-05-18 LAB — HEMOGLOBIN A1C
Hgb A1c MFr Bld: 10.1 % — ABNORMAL HIGH (ref 4.8–5.6)
Mean Plasma Glucose: 243.17 mg/dL

## 2019-05-18 LAB — HIV ANTIBODY (ROUTINE TESTING W REFLEX): HIV Screen 4th Generation wRfx: NONREACTIVE

## 2019-05-18 LAB — LACTATE DEHYDROGENASE: LDH: 299 U/L — ABNORMAL HIGH (ref 98–192)

## 2019-05-18 LAB — HEPATITIS B SURFACE ANTIGEN: Hepatitis B Surface Ag: NONREACTIVE

## 2019-05-18 LAB — FERRITIN: Ferritin: 293 ng/mL (ref 24–336)

## 2019-05-18 MED ORDER — POTASSIUM CHLORIDE CRYS ER 20 MEQ PO TBCR
40.0000 meq | EXTENDED_RELEASE_TABLET | ORAL | Status: AC
  Administered 2019-05-18: 40 meq via ORAL
  Filled 2019-05-18: qty 2

## 2019-05-18 MED ORDER — VITAMIN C 500 MG PO TABS
500.0000 mg | ORAL_TABLET | Freq: Every day | ORAL | Status: DC
  Administered 2019-05-18 – 2019-05-19 (×2): 500 mg via ORAL
  Filled 2019-05-18 (×3): qty 1

## 2019-05-18 MED ORDER — INSULIN ASPART 100 UNIT/ML ~~LOC~~ SOLN: 0.0000 [IU] | Freq: Three times a day (TID) | SUBCUTANEOUS | Status: DC

## 2019-05-18 MED ORDER — ENOXAPARIN SODIUM 40 MG/0.4ML ~~LOC~~ SOLN
40.0000 mg | SUBCUTANEOUS | Status: DC
  Administered 2019-05-18: 40 mg via SUBCUTANEOUS
  Filled 2019-05-18: qty 0.4

## 2019-05-18 MED ORDER — AMLODIPINE BESYLATE 5 MG PO TABS
10.0000 mg | ORAL_TABLET | Freq: Once | ORAL | Status: AC
  Administered 2019-05-18: 10 mg via ORAL
  Filled 2019-05-18: qty 2

## 2019-05-18 MED ORDER — DEXAMETHASONE SODIUM PHOSPHATE 10 MG/ML IJ SOLN
6.0000 mg | INTRAMUSCULAR | Status: DC
  Administered 2019-05-18 – 2019-05-19 (×2): 6 mg via INTRAVENOUS
  Filled 2019-05-18 (×2): qty 1

## 2019-05-18 MED ORDER — ZINC SULFATE 220 (50 ZN) MG PO CAPS
220.0000 mg | ORAL_CAPSULE | Freq: Every day | ORAL | Status: DC
  Administered 2019-05-18 – 2019-05-19 (×2): 220 mg via ORAL
  Filled 2019-05-18 (×3): qty 1

## 2019-05-18 MED ORDER — ONDANSETRON HCL 4 MG PO TABS: 4.0000 mg | ORAL_TABLET | Freq: Four times a day (QID) | ORAL | Status: DC | PRN

## 2019-05-18 MED ORDER — ONDANSETRON HCL 4 MG/2ML IJ SOLN: 4.0000 mg | Freq: Four times a day (QID) | INTRAMUSCULAR | Status: DC | PRN

## 2019-05-18 MED ORDER — SIMVASTATIN 20 MG PO TABS
40.0000 mg | ORAL_TABLET | Freq: Every day | ORAL | Status: DC
  Administered 2019-05-18: 40 mg via ORAL
  Filled 2019-05-18: qty 2

## 2019-05-18 MED ORDER — GUAIFENESIN-DM 100-10 MG/5ML PO SYRP
10.0000 mL | ORAL_SOLUTION | ORAL | Status: DC | PRN
  Filled 2019-05-18: qty 10

## 2019-05-18 MED ORDER — GABAPENTIN 300 MG PO CAPS
1200.0000 mg | ORAL_CAPSULE | Freq: Two times a day (BID) | ORAL | Status: DC
  Administered 2019-05-18 – 2019-05-19 (×3): 1200 mg via ORAL
  Filled 2019-05-18: qty 4
  Filled 2019-05-18: qty 3
  Filled 2019-05-18: qty 4
  Filled 2019-05-18: qty 3

## 2019-05-18 MED ORDER — INSULIN GLARGINE 100 UNIT/ML ~~LOC~~ SOLN
20.0000 [IU] | Freq: Two times a day (BID) | SUBCUTANEOUS | Status: DC
  Administered 2019-05-19: 20 [IU] via SUBCUTANEOUS
  Filled 2019-05-18 (×3): qty 0.2

## 2019-05-18 MED ORDER — DIFLUPREDNATE 0.05 % OP EMUL: 1.0000 [drp] | Freq: Two times a day (BID) | OPHTHALMIC | Status: DC

## 2019-05-18 MED ORDER — FAMOTIDINE 20 MG PO TABS
20.0000 mg | ORAL_TABLET | Freq: Two times a day (BID) | ORAL | Status: DC
  Administered 2019-05-18 – 2019-05-19 (×4): 20 mg via ORAL
  Filled 2019-05-18 (×5): qty 1

## 2019-05-18 MED ORDER — METOPROLOL TARTRATE 50 MG PO TABS
50.0000 mg | ORAL_TABLET | Freq: Every day | ORAL | Status: DC
  Administered 2019-05-18 – 2019-05-19 (×2): 50 mg via ORAL
  Filled 2019-05-18: qty 2
  Filled 2019-05-18 (×2): qty 1

## 2019-05-18 MED ORDER — LOPERAMIDE HCL 2 MG PO CAPS
2.0000 mg | ORAL_CAPSULE | ORAL | Status: DC | PRN
  Administered 2019-05-18: 2 mg via ORAL
  Filled 2019-05-18: qty 1

## 2019-05-18 MED ORDER — SODIUM CHLORIDE 0.9% FLUSH: 3.0000 mL | Freq: Once | INTRAVENOUS | Status: DC

## 2019-05-18 MED ORDER — AMLODIPINE BESYLATE 5 MG PO TABS
5.0000 mg | ORAL_TABLET | Freq: Every day | ORAL | Status: DC
  Administered 2019-05-19: 5 mg via ORAL
  Filled 2019-05-18 (×2): qty 1

## 2019-05-18 MED ORDER — ALBUTEROL SULFATE HFA 108 (90 BASE) MCG/ACT IN AERS
2.0000 | INHALATION_SPRAY | Freq: Four times a day (QID) | RESPIRATORY_TRACT | Status: DC
  Administered 2019-05-18 – 2019-05-20 (×7): 2 via RESPIRATORY_TRACT
  Filled 2019-05-18: qty 6.7

## 2019-05-18 MED ORDER — VITAMIN D3 25 MCG (1000 UNIT) PO TABS
1000.0000 [IU] | ORAL_TABLET | Freq: Every day | ORAL | Status: DC
  Administered 2019-05-19: 1000 [IU] via ORAL
  Filled 2019-05-18 (×4): qty 1

## 2019-05-18 MED ORDER — SODIUM CHLORIDE 0.9 % IV BOLUS
1000.0000 mL | Freq: Once | INTRAVENOUS | Status: AC
  Administered 2019-05-18: 1000 mL via INTRAVENOUS

## 2019-05-18 NOTE — ED Notes (Signed)
I stuck pt for blood I was unsuccessful. Pt had one lose stool

## 2019-05-18 NOTE — ED Notes (Signed)
ED TO INPATIENT HANDOFF REPORT  ED Nurse Name and Phone #: Analeigha Nauman,[phone]  S Name/Age/Gender Kyle [name] 58 y.o. male Room/Bed: 011C/011C  Code Status   Code Status: Full Code  Home/SNF/Other Home Patient oriented to: self, place, time and situation Is this baseline? Yes   Triage Complete: Triage complete  Chief Complaint WEAKNESS AND DIARRHEA  Triage Note Patient reports feeling ill for 3 weeks, initially started with sore throat that improved after a couple of days - was tested for covid then but it was negative, however states his wife has since tested positive. He endorses productive cough, shortness of breath, and generalized body aches. O2 89% in triage, placed on 2L nasal cannula.    Allergies Allergies  Allergen Reactions  . Hydrocodone     Other reaction(s): Hallucinations  . Penicillins Rash    Has patient had a PCN reaction causing immediate rash, facial/tongue/throat swelling, SOB or lightheadedness with hypotension: YES Has patient had a PCN reaction causing severe rash involving mucus membranes or skin necrosis: NO Has patient had a PCN reaction that required hospitalization: NO Has patient had a PCN reaction occurring within the last 10 years: NO If all of the above answers are "NO", then may proceed with Cephalosporin use.  Marland Kitchen Oxycodone Other (See Comments)    Hallucinations    Level of Care/Admitting Diagnosis ED Disposition    ED Disposition Condition [facility] Area: [place] [[id number]]  Level of Care: Telemetry [5]  Covid Evaluation: Confirmed COVID Positive  Diagnosis: Acute respiratory failure with [id number]  Admitting Physician: Norval Morton [id number]  Attending Physician: Norval Morton [id number]  Estimated length of stay: past midnight tomorrow  Certification:: I certify this patient will need inpatient services for at least 2 midnights  PT Class (Do Not Modify): Inpatient  [101]  PT Acc Code (Do Not Modify): Private [1]       B Medical/Surgery History Past Medical History:  Diagnosis Date  . Diabetes mellitus without complication ([place])   . Elevated cholesterol   . GERD (gastroesophageal reflux disease)   . History of kidney stones 2013  . Hypertension   . Pancreatitis age 67   with mumps, no problems since  . PONV (postoperative nausea and vomiting) yrs ago   Past Surgical History:  Procedure Laterality Date  . ankle surgery for fx Right 24 yrs ago  . COLONOSCOPY WITH PROPOFOL N/A 02/20/2016   Procedure: COLONOSCOPY WITH PROPOFOL;  Surgeon: Garlan Fair, MD;  Location: [facility];  Service: Endoscopy;  Laterality: N/A;  . cyst removed between legs  11/2013  . EXTRACORPOREAL SHOCK WAVE LITHOTRIPSY  2013     A IV Location/Drains/Wounds Patient Lines/Drains/Airways Status   Active Line/Drains/Airways    Name:   Placement date:   Placement time:   Site:   Days:   Peripheral IV 05/18/19 Left Hand   05/18/19    1045    Hand   less than 1   Airway   02/20/16    1305     1183          Intake/Output Last 24 hours No intake or output data in the 24 hours ending 05/18/19 2031  Labs/Imaging Results for orders placed or performed during the hospital encounter of 05/18/19 (from the past 48 hour(s))  Comprehensive metabolic panel     Status: Abnormal   Collection Time: 05/18/19  9:44 AM  Result Value Ref Range   Sodium  138 135 - 145 mmol/L   Potassium 3.2 (L) 3.5 - 5.1 mmol/L   Chloride 98 98 - 111 mmol/L   CO2 26 22 - 32 mmol/L   Glucose, Bld 250 (H) 70 - 99 mg/dL   BUN 16 6 - 20 mg/dL   Creatinine, Ser 1.02 0.61 - 1.24 mg/dL   Calcium 9.1 8.9 - 10.3 mg/dL   Total Protein 7.1 6.5 - 8.1 g/dL   Albumin 3.4 (L) 3.5 - 5.0 g/dL   AST 29 15 - 41 U/L   ALT 29 0 - 44 U/L   Alkaline Phosphatase 63 38 - 126 U/L   Total Bilirubin 0.9 0.3 - 1.2 mg/dL   GFR calc non Af Amer >60 >60 mL/min   GFR calc Af Amer >60 >60 mL/min   Anion gap 14 5 - 15     Comment: Performed at [facility] [address] [place], [place] [address]  CBC with Differential     Status: Abnormal   Collection Time: 05/18/19  9:44 AM  Result Value Ref Range   WBC 7.7 4.0 - 10.5 K/uL   RBC 5.60 4.22 - 5.81 MIL/uL   Hemoglobin 17.3 (H) 13.0 - 17.0 g/dL   HCT 50.4 39.0 - 52.0 %   MCV 90.0 80.0 - 100.0 fL   MCH 30.9 26.0 - 34.0 pg   MCHC 34.3 30.0 - 36.0 g/dL   RDW 12.6 11.5 - 15.5 %   Platelets 234 150 - 400 K/uL   nRBC 0.0 0.0 - 0.2 %   Neutrophils Relative % 78 %   Neutro Abs 6.0 1.7 - 7.7 K/uL   Lymphocytes Relative 13 %   Lymphs Abs 1.0 0.7 - 4.0 K/uL   Monocytes Relative 8 %   Monocytes Absolute 0.6 0.1 - 1.0 K/uL   Eosinophils Relative 0 %   Eosinophils Absolute 0.0 0.0 - 0.5 K/uL   Basophils Relative 0 %   Basophils Absolute 0.0 0.0 - 0.1 K/uL   Immature Granulocytes 1 %   Abs Immature Granulocytes 0.04 0.00 - 0.07 K/uL    Comment: Performed at [facility] [address] [place], [place] [address]  ABO/Rh     Status: None   Collection Time: 05/18/19  9:44 AM  Result Value Ref Range   ABO/RH(D)      O POS Performed at [facility] [address], [place], [place] [address]   POC CBG, ED     Status: Abnormal   Collection Time: 05/18/19 10:06 AM  Result Value Ref Range   Glucose-Capillary 231 (H) 70 - 99 mg/dL  SARS CORONAVIRUS 2 (TAT 6-24 HRS) Nasopharyngeal Nasopharyngeal Swab     Status: Abnormal   Collection Time: 05/18/19 10:26 AM   Specimen: Nasopharyngeal Swab  Result Value Ref Range   SARS Coronavirus 2 POSITIVE (A) NEGATIVE    Comment: RESULT CALLED TO, READ BACK BY AND VERIFIED WITH: P.JOHNSTON RN [id number] 05/18/2019 MCCORMICK K (NOTE) SARS-CoV-2 target nucleic acids are DETECTED. The SARS-CoV-2 RNA is generally detectable in upper and lower respiratory specimens during the acute phase of infection. Positive results are indicative of active infection with SARS-CoV-2. Clinical  correlation with patient  history and other diagnostic information is necessary to determine patient infection status. Positive results do  not rule out bacterial infection or co-infection with other viruses. The expected result is Negative. Fact Sheet for Patients: SugarRoll.be Fact Sheet for Healthcare Providers: https://www.woods-mathews.com/ This test is not yet approved or  cleared by the [place] and  has been authorized for detection and/or diagnosis of SARS-CoV-2 by FDA under an Emergency Use Authorization (EUA). This EUA will remain  in effect (meaning this test can be used)  for the duration of the COVID-19 declaration under Section 564(b)(1) of the Act, 21 U.S.C. section 360bbb-3(b)(1), unless the authorization is terminated or revoked sooner. Performed at [facility], [place] [address] [place], [place] [address]   HIV Antibody (routine testing w rflx)     Status: None   Collection Time: 05/18/19  2:30 PM  Result Value Ref Range   HIV Screen 4th Generation wRfx NON REACTIVE NON REACTIVE    Comment: Performed at [facility], [place] [address] [place], [place] [address]  Brain natriuretic peptide     Status: None   Collection Time: 05/18/19  2:30 PM  Result Value Ref Range   B Natriuretic Peptide 56.2 0.0 - 100.0 pg/mL    Comment: Performed at [facility] [address], [place], [place] [address]  C-reactive protein     Status: Abnormal   Collection Time: 05/18/19  2:30 PM  Result Value Ref Range   CRP 3.6 (H) <1.0 mg/dL    Comment: Performed at [facility] [address], [place], [place] [address]  D-dimer, quantitative (not at [facility])     Status: Abnormal   Collection Time: 05/18/19  2:30 PM  Result Value Ref Range   D-Dimer, Quant 0.69 (H) 0.00 - 0.50 ug/mL-FEU    Comment: (NOTE) At the manufacturer cut-off of 0.50 ug/mL FEU, this assay has been documented to exclude PE with a sensitivity and negative  predictive value of 97 to 99%.  At this time, this assay has not been approved by the FDA to exclude DVT/VTE. Results should be correlated with clinical presentation. Performed at [facility], [place] [address], [place], [place] [address]   Ferritin     Status: None   Collection Time: 05/18/19  2:30 PM  Result Value Ref Range   Ferritin 293 24 - 336 ng/mL    Comment: Performed at [facility], [place] [address] [place], [place] [address]  Fibrinogen     Status: Abnormal   Collection Time: 05/18/19  2:30 PM  Result Value Ref Range   Fibrinogen 547 (H) 210 - 475 mg/dL    Comment: Performed at [facility] [address], [place], [place] [address]  Hepatitis B surface antigen     Status: None   Collection Time: 05/18/19  2:30 PM  Result Value Ref Range   Hepatitis B Surface Ag NON REACTIVE NON REACTIVE    Comment: Performed at [facility] [address] [place], [place] [address]  Lactate dehydrogenase     Status: Abnormal   Collection Time: 05/18/19  2:30 PM  Result Value Ref Range   LDH 299 (H) 98 - 192 U/L    Comment: Performed at [facility], [place] [address], [place], [place] [address]  Procalcitonin     Status: None   Collection Time: 05/18/19  2:30 PM  Result Value Ref Range   Procalcitonin <0.10 ng/mL    Comment:        Interpretation: PCT (Procalcitonin) <= 0.5 ng/mL: Systemic infection (sepsis) is not likely. Local bacterial infection is possible. (NOTE)       Sepsis PCT Algorithm           Lower  Respiratory Tract                                      Infection PCT Algorithm    ----------------------------     ----------------------------         PCT < 0.25 ng/mL                PCT < 0.10 ng/mL         Strongly encourage             Strongly discourage   discontinuation of antibiotics    initiation of antibiotics    ----------------------------     -----------------------------       PCT 0.25 - 0.50 ng/mL            PCT 0.10 - 0.25  ng/mL               OR       >80% decrease in PCT            Discourage initiation of                                            antibiotics      Encourage discontinuation           of antibiotics    ----------------------------     -----------------------------         PCT >= 0.50 ng/mL              PCT 0.26 - 0.50 ng/mL               AND        <80% decrease in PCT             Encourage initiation of                                             antibiotics       Encourage continuation           of antibiotics    ----------------------------     -----------------------------        PCT >= 0.50 ng/mL                  PCT > 0.50 ng/mL               AND         increase in PCT                  Strongly encourage                                      initiation of antibiotics    Strongly encourage escalation           of antibiotics                                     -----------------------------  PCT <= 0.25 ng/mL                                                 OR                                        > 80% decrease in PCT                                     Discontinue / Do not initiate                                             antibiotics Performed at [facility], [place] [address], [place], [place] [address]   Troponin I (High Sensitivity)     Status: Abnormal   Collection Time: 05/18/19  2:30 PM  Result Value Ref Range   Troponin I (High Sensitivity) 25 (H) <18 ng/L    Comment: (NOTE) Elevated high sensitivity troponin I (hsTnI) values and significant  changes across serial measurements may suggest ACS but many other  chronic and acute conditions are known to elevate hsTnI results.  Refer to the "Links" section for chest pain algorithms and additional  guidance. Performed at [facility], [place] [address], [place], [place] [address]   Hemoglobin A1c     Status: Abnormal   Collection Time: 05/18/19  2:43 PM  Result Value  Ref Range   Hgb A1c MFr Bld 10.1 (H) 4.8 - 5.6 %    Comment: (NOTE) Pre diabetes:          5.7%-6.4% Diabetes:              >6.4% Glycemic control for   <7.0% adults with diabetes    Mean Plasma Glucose 243.17 mg/dL    Comment: Performed at [facility] [address] [place], [place] [address]  CBG monitoring, ED     Status: Abnormal   Collection Time: 05/18/19  6:14 PM  Result Value Ref Range   Glucose-Capillary 304 (H) 70 - 99 mg/dL  CBG monitoring, ED     Status: Abnormal   Collection Time: 05/18/19  7:24 PM  Result Value Ref Range   Glucose-Capillary 310 (H) 70 - 99 mg/dL  CBG monitoring, ED     Status: Abnormal   Collection Time: 05/18/19  8:16 PM  Result Value Ref Range   Glucose-Capillary 345 (H) 70 - 99 mg/dL   Dg Chest Portable 1 View  Result Date: 05/18/2019 CLINICAL DATA:  Shortness of breath EXAM: PORTABLE CHEST 1 VIEW COMPARISON:  April 27, 2016 FINDINGS: Lungs are clear. Heart size and pulmonary vascularity are normal. No adenopathy. No bone lesions. IMPRESSION: No edema or consolidation. Electronically Signed   By: Lowella Grip III M.D.   On: 05/18/2019 10:19    Pending Labs Unresulted Labs (From admission, onward)    Start     Ordered   05/19/19 0500  Phosphorus  Daily,   R     05/18/19 1201   05/19/19 0500  Ferritin  Daily,  R     05/18/19 1201   05/19/19 0500  Magnesium  Daily,   R     05/18/19 1201   05/19/19 0500  D-dimer, quantitative (not at [facility])  Daily,   R     05/18/19 1201   05/19/19 0500  C-reactive protein  Daily,   R     05/18/19 1201   05/19/19 0500  Comprehensive metabolic panel  Daily,   R     05/18/19 1201   05/19/19 0500  CBC with Differential/Platelet  Daily,   R     05/18/19 1201          Vitals/Pain Today's Vitals   05/18/19 1915 05/18/19 2008 05/18/19 2015 05/18/19 2022  BP: (!) 147/96 (!) 152/98 (!) 131/104   Pulse: 96 92    Resp: 17  13   Temp:      TempSrc:      SpO2: 97%   94%  PainSc:         Isolation Precautions Airborne and Contact precautions  Medications Medications  sodium chloride flush (NS) 0.9 % injection 3 mL (3 mLs Intravenous Not Given 05/18/19 1938)  enoxaparin (LOVENOX) injection 40 mg (40 mg Subcutaneous Given 05/18/19 2011)  albuterol (VENTOLIN HFA) 108 (90 Base) MCG/ACT inhaler 2 puff (2 puffs Inhalation Given 05/18/19 2010)  dexamethasone (DECADRON) injection 6 mg (6 mg Intravenous Given 05/18/19 1245)  guaiFENesin-dextromethorphan (ROBITUSSIN DM) 100-10 MG/5ML syrup 10 mL (has no administration in time range)  vitamin C (ASCORBIC ACID) tablet 500 mg (500 mg Oral Given 05/18/19 1245)  zinc sulfate capsule 220 mg (220 mg Oral Given 05/18/19 1358)  ondansetron (ZOFRAN) tablet 4 mg (has no administration in time range)    Or  ondansetron (ZOFRAN) injection 4 mg (has no administration in time range)  famotidine (PEPCID) tablet 20 mg (20 mg Oral Given 05/18/19 1245)  loperamide (IMODIUM) capsule 2 mg (2 mg Oral Given 05/18/19 1452)  amLODipine (NORVASC) tablet 5 mg (has no administration in time range)  metoprolol tartrate (LOPRESSOR) tablet 50 mg (50 mg Oral Given 05/18/19 2008)  Basaglar KwikPen KwikPen 20 Units (20 Units Subcutaneous Not Given 05/18/19 1957)  cholecalciferol (VITAMIN D) tablet 1,000 Units (1,000 Units Oral Not Given 05/18/19 1957)  Difluprednate 0.05 % EMUL 1 drop (1 drop Right Eye Not Given 05/18/19 1957)  gabapentin (NEURONTIN) capsule 1,200 mg (has no administration in time range)  simvastatin (ZOCOR) tablet 40 mg (40 mg Oral Given 05/18/19 2007)  insulin aspart (novoLOG) injection 0-15 Units (has no administration in time range)  amLODipine (NORVASC) tablet 10 mg (10 mg Oral Given 05/18/19 1123)  sodium chloride 0.9 % bolus 1,000 mL (0 mLs Intravenous Stopped 05/18/19 1225)  potassium chloride SA (KLOR-CON) CR tablet 40 mEq (40 mEq Oral Given 05/18/19 1358)    Mobility walks Low fall risk   Focused Assessments Pulmonary  Assessment Handoff:  Lung sounds:   O2 Device: Nasal Cannula O2 Flow Rate (L/min): 4 L/min      R Recommendations: See Admitting Provider Note  Report given to:   Additional Notes: N/A

## 2019-05-18 NOTE — ED Notes (Signed)
Please call spouse Kyle [name] @ [phone] for a status update--Kyle [name]

## 2019-05-18 NOTE — H&P (Signed)
History and Physical    Kyle [name] [id number] DOB: 09-02-1960 DOA: 05/18/2019  Referring MD/NP/PA: Delia Heady, PA-C PCP: Patient, No Pcp Per  Patient coming from: Home  Chief Complaint: Feeling bad  I have personally briefly reviewed patient's old medical records in [place]   HPI: Kyle [name] is a 58 y.o. male with medical history significant of hypertension, hyperlipidemia, diabetes mellitus type 2, pancreatitis related with mumps, and nephrolithiasis.  He presents with a 3-week history of feeling unwell.  Complains symptoms initially started with sore throat and developed into congestion in his chest.  He reports having mostly a nonproductive cough.  Had been able to produce some sputum.  He testes negative for COVID-19 2-1/2 weeks ago when symptoms first started, but he reports that his wife was tested a few days later and tested positive for COVID-19. He had tried using over-the-counter cough medicines and a couple dose of his wife's prescription for steroids  Associated symptoms include generalized body aches, decreased appetite, nausea, vomiting, and diarrhea.  Reports emesis and diarrhea are nonbloody in appearance.  ED Course: On admission into the emergency department patient was seen to be afebrile, pulse 88-107, respiration 15-26, blood pressures 141/84-164/106, and O2 saturations as low as 86% with improvement on 3 L nasal cannula oxygen greater than 92%.  Labs significant for hemoglobin 17.3, potassium 3.2, and glucose 250.  Patient was given 1 L normal saline IV fluids and 10 mg of Norvasc.  Review of systems: A complete 10 point review of systems was performed and negative except for as noted above in the HPI.  Past Medical History:  Diagnosis Date  . Diabetes mellitus without complication ([place])   . Elevated cholesterol   . GERD (gastroesophageal reflux disease)   . History of kidney stones 2013  . Hypertension   . Pancreatitis age 89   with  mumps, no problems since  . PONV (postoperative nausea and vomiting) yrs ago    Past Surgical History:  Procedure Laterality Date  . ankle surgery for fx Right 24 yrs ago  . COLONOSCOPY WITH PROPOFOL N/A 02/20/2016   Procedure: COLONOSCOPY WITH PROPOFOL;  Surgeon: Garlan Fair, MD;  Location: [facility];  Service: Endoscopy;  Laterality: N/A;  . cyst removed between legs  11/2013  . EXTRACORPOREAL SHOCK WAVE LITHOTRIPSY  2013     reports that he has never smoked. His smokeless tobacco use includes snuff. He reports that he does not drink alcohol or use drugs.  Allergies  Allergen Reactions  . Hydrocodone     Other reaction(s): Hallucinations  . Penicillins Rash    Has patient had a PCN reaction causing immediate rash, facial/tongue/throat swelling, SOB or lightheadedness with hypotension: YES Has patient had a PCN reaction causing severe rash involving mucus membranes or skin necrosis: NO Has patient had a PCN reaction that required hospitalization: NO Has patient had a PCN reaction occurring within the last 10 years: NO If all of the above answers are "NO", then may proceed with Cephalosporin use.  Marland Kitchen Oxycodone Other (See Comments)    Hallucinations    Family History  Problem Relation Age of Onset  . Pancreatic cancer Mother   . Alzheimer's disease Father     Prior to Admission medications   Medication Sig Start Date End Date Taking? Authorizing Provider  amLODipine (NORVASC) 5 MG tablet Take 5 mg by mouth daily. 01/24/16   [provider]  benazepril-hydrochlorthiazide (LOTENSIN HCT) 20-25 MG tablet Take 1 tablet by  mouth daily. 12/14/15   [provider]  cholecalciferol (VITAMIN D) 1000 units tablet Take 1,000 Units by mouth daily.    [provider]  cyanocobalamin (V-R VITAMIN B-12) 500 MCG tablet Take 500 mcg by mouth daily.    [provider]  gabapentin (NEURONTIN) 300 MG capsule Take 300 mg by mouth 3 (three) times daily. 12/14/15    [provider]  Insulin Detemir (LEVEMIR FLEXTOUCH) 100 UNIT/ML Pen Inject 10 Units into the skin daily. 02/10/16   [provider]  lidocaine (LIDODERM) 5 % Place 1 patch onto the skin daily. Remove & Discard patch within 12 hours or as directed by MD 01/13/18   Maczis, Barth Kirks, PA-C  metFORMIN (GLUCOPHAGE-XR) 500 MG 24 hr tablet Take 500 mg by mouth 2 (two) times daily. 12/28/15   [provider]  methocarbamol (ROBAXIN) 500 MG tablet Take 1 tablet (500 mg total) by mouth 2 (two) times daily. 01/13/18   Maczis, Barth Kirks, PA-C  naproxen (NAPROSYN) 500 MG tablet Take 1 tablet (500 mg total) by mouth 2 (two) times daily. 01/13/18   Maczis, Barth Kirks, PA-C  ONGLYZA 2.5 MG TABS tablet Take 2.5 mg by mouth daily. 01/30/16   [provider]  RA KRILL OIL 500 MG CAPS Take 500 mg by mouth daily.    [provider]  simvastatin (ZOCOR) 20 MG tablet Take 20 mg by mouth daily. 01/24/16   [provider]  vitamin C (ASCORBIC ACID) 500 MG tablet Take 500 mg by mouth daily.    [provider]  Vitamins/Minerals TABS Take 1 tablet by mouth daily.    [provider]    Physical Exam:  Constitutional: Obese male who appears ill but in no acute distress Vitals:   05/18/19 1021 05/18/19 1045 05/18/19 1100 05/18/19 1123  BP: (!) 164/106 (!) 152/98 (!) 141/84   Pulse: (!) 107     Resp: 20 (!) 26 (!) 26 (!) 21  Temp:      TempSrc:      SpO2: 90%   94%   Eyes: PERRL, lids and conjunctivae normal ENMT: Mucous membranes are moist. Posterior pharynx clear of any exudate or lesions. .  Neck: normal, supple, no masses, no thyromegaly Respiratory: Tachypneic with decreased overall aeration.  No wheezes, rhonchi, or rales noted.  Patient on 3 L nasal cannula oxygen maintaining O2 saturations. Cardiovascular: Regular rate and rhythm, no murmurs / rubs / gallops. No extremity edema. 2+ pedal pulses. No carotid bruits.  Abdomen: no tenderness, no  masses palpated. No hepatosplenomegaly. Bowel sounds positive.  Musculoskeletal: no clubbing / cyanosis. No joint deformity upper and lower extremities. Good ROM, no contractures. Normal muscle tone.  Skin: no rashes, lesions, ulcers. No induration Neurologic: CN 2-12 grossly intact. Sensation intact, DTR normal. Strength 5/5 in all 4.  Psychiatric: Normal judgment and insight. Alert and oriented x 3. Normal mood.     Labs on Admission: I have personally reviewed following labs and imaging studies  CBC: Recent Labs  Lab 05/18/19 0944  WBC 7.7  NEUTROABS 6.0  HGB 17.3*  HCT 50.4  MCV 90.0  PLT [id number]   Basic Metabolic Panel: Recent Labs  Lab 05/18/19 0944  NA 138  K 3.2*  CL 98  CO2 26  GLUCOSE 250*  BUN 16  CREATININE 1.02  CALCIUM 9.1   GFR: CrCl cannot be calculated (Unknown ideal weight.). Liver Function Tests: Recent Labs  Lab 05/18/19 0944  AST 29  ALT 29  ALKPHOS 63  BILITOT 0.9  PROT 7.1  ALBUMIN 3.4*   No results for input(s): LIPASE, AMYLASE in the last 168 hours. No results for input(s): AMMONIA in the last 168 hours. Coagulation Profile: No results for input(s): INR, PROTIME in the last 168 hours. Cardiac Enzymes: No results for input(s): CKTOTAL, CKMB, CKMBINDEX, TROPONINI in the last 168 hours. BNP (last 3 results) No results for input(s): PROBNP in the last 8760 hours. HbA1C: No results for input(s): HGBA1C in the last 72 hours. CBG: Recent Labs  Lab 05/18/19 1006  GLUCAP 231*   Lipid Profile: No results for input(s): CHOL, HDL, LDLCALC, TRIG, CHOLHDL, LDLDIRECT in the last 72 hours. Thyroid Function Tests: No results for input(s): TSH, T4TOTAL, FREET4, T3FREE, THYROIDAB in the last 72 hours. Anemia Panel: No results for input(s): VITAMINB12, FOLATE, FERRITIN, TIBC, IRON, RETICCTPCT in the last 72 hours. Urine analysis: No results found for: COLORURINE, APPEARANCEUR, LABSPEC, PHURINE, GLUCOSEU, HGBUR, BILIRUBINUR, KETONESUR, PROTEINUR,  UROBILINOGEN, NITRITE, LEUKOCYTESUR Sepsis Labs: No results found for this or any previous visit (from the past 240 hour(s)).   Radiological Exams on Admission: Dg Chest Portable 1 View  Result Date: 05/18/2019 CLINICAL DATA:  Shortness of breath EXAM: PORTABLE CHEST 1 VIEW COMPARISON:  April 27, 2016 FINDINGS: Lungs are clear. Heart size and pulmonary vascularity are normal. No adenopathy. No bone lesions. IMPRESSION: No edema or consolidation. Electronically Signed   By: Lowella Grip III M.D.   On: 05/18/2019 10:19    EKG: Independently reviewed.  Sinus rhythm 88 bpm with PVC  Assessment/Plan Acute respiratory failure with hypoxia secondary to suspected Covid 19 virus infection: Acute.  Patient presents with complaints of cough, shortness of breath, diarrhea.  Chest x-ray otherwise clear.  Patient's O2 saturation was noted to be as low as 86% on room air and he was placed on 3 L nasal cannula oxygen.  Inflammatory markers had not been obtained.  COVID-19 screening was positive. -Admit to a medical telemetry bed -COVID-19 admission order set utilized -Continuous pulse oximetry with nasal cannula oxygen to maintain O2 saturation greater than 90%. -Check inflammatory markers stat -Albuterol inhaler every 6 hours -Decadron IV 6 mg IV daily -Vitamin C and zinc -Antitussives as needed -Daily monitoring of inflammatory markers  Essential hypertension: Blood pressures elevated up to 164/106 on admission. -Held benazepril-hydrochlorothiazide due to diarrhea and signs of dehydration -Continue metoprolol and amlodipine  Diabetes mellitus type 2, uncontrolled, with neuropathy: No recent hemoglobin A1c on file.  Home regimen includes sitagliptin-Metformin and Lantus 20 units twice daily. -Hypoglycemic protocols -Check hemoglobin A1c -Hold sitaglptin-metformin -Continue glargine 20 units twice daily  -CBGs before every meal and at bedtime with sensitive SSI  -Adjust insulin regimen as  needed  Hypokalemia: Acute.  Initial potassium 3.2.  Suspect secondary to diarrhea. -Give 40 mEq of potassium chloride -Continue to monitor and replace as needed  Diarrhea: Secondary to Covid -Imodium as needed  Smokeless tobacco use: Patient admits to using still -Continue to counsel on need cessation of smokeless tobacco use   GI prophylaxis: Pepcid DVT prophylaxis: Lovenox Code Status: Full Family Communication: No family present at bedside Disposition Plan: Likely discharge home in 2 to 3 days once medically stable Consults called: None Admission status: Inpatient  Norval Morton MD [facility] Pager [phone]   If 7PM-7AM, please contact night-coverage [email or web address] Password [facility]  05/18/2019, 11:39 AM

## 2019-05-18 NOTE — ED Triage Notes (Signed)
Patient reports feeling ill for 3 weeks, initially started with sore throat that improved after a couple of days - was tested for covid then but it was negative, however states his wife has since tested positive. He endorses productive cough, shortness of breath, and generalized body aches. O2 89% in triage, placed on 2L nasal cannula.

## 2019-05-18 NOTE — ED Notes (Signed)
Patient ambulated in room sats dropped to 86

## 2019-05-18 NOTE — ED Notes (Signed)
Regular lunch tray ordered 

## 2019-05-18 NOTE — ED Notes (Signed)
EDP at bedside  

## 2019-05-18 NOTE — ED Provider Notes (Signed)
[facility] Provider Note   CSN: [id number] Arrival date & time: 05/18/19  [id number]     History   Chief Complaint Chief Complaint  Patient presents with  . Cough  . Shortness of Breath    HPI [name] [name] is a 58 y.o. male with a past medical history of IDDM, hypertension, GERD who presents to ED for evaluation of generalized body aches, intermittently productive cough, shortness of breath, chills and concern for COVID-19 infection.  Symptoms began 3 weeks ago and have slowly gotten worse.  He had a negative Covid test 2-1/2 weeks ago when symptoms first began.  He has been living with his wife who tested positive for Covid.  He has not tried any home medications to help with his symptoms.  Admits that he has been having a decreased appetite due to nausea and constant diarrhea.  Denies any chest pain, hemoptysis, leg swelling, injuries or falls, abdominal pain, tobacco use or supplemental oxygen use at home.     HPI  Past Medical History:  Diagnosis Date  . Diabetes mellitus without complication ([place])   . Elevated cholesterol   . GERD (gastroesophageal reflux disease)   . History of kidney stones 2013  . Hypertension   . Pancreatitis age 73   with mumps, no problems since  . PONV (postoperative nausea and vomiting) yrs ago    There are no active problems to display for this patient.   Past Surgical History:  Procedure Laterality Date  . ankle surgery for fx Right 24 yrs ago  . COLONOSCOPY WITH PROPOFOL N/A 02/20/2016   Procedure: COLONOSCOPY WITH PROPOFOL;  Surgeon: Garlan Fair, MD;  Location: [facility];  Service: Endoscopy;  Laterality: N/A;  . cyst removed between legs  11/2013  . EXTRACORPOREAL SHOCK WAVE LITHOTRIPSY  2013        Home Medications    Prior to Admission medications   Medication Sig Start Date End Date Taking? Authorizing Provider  amLODipine (NORVASC) 5 MG tablet Take 5 mg by mouth daily. 01/24/16    [provider]  benazepril-hydrochlorthiazide (LOTENSIN HCT) 20-25 MG tablet Take 1 tablet by mouth daily. 12/14/15   [provider]  cholecalciferol (VITAMIN D) 1000 units tablet Take 1,000 Units by mouth daily.    [provider]  cyanocobalamin (V-R VITAMIN B-12) 500 MCG tablet Take 500 mcg by mouth daily.    [provider]  gabapentin (NEURONTIN) 300 MG capsule Take 300 mg by mouth 3 (three) times daily. 12/14/15   [provider]  Insulin Detemir (LEVEMIR FLEXTOUCH) 100 UNIT/ML Pen Inject 10 Units into the skin daily. 02/10/16   [provider]  lidocaine (LIDODERM) 5 % Place 1 patch onto the skin daily. Remove & Discard patch within 12 hours or as directed by MD 01/13/18   Maczis, Barth Kirks, PA-C  metFORMIN (GLUCOPHAGE-XR) 500 MG 24 hr tablet Take 500 mg by mouth 2 (two) times daily. 12/28/15   [provider]  methocarbamol (ROBAXIN) 500 MG tablet Take 1 tablet (500 mg total) by mouth 2 (two) times daily. 01/13/18   Maczis, Barth Kirks, PA-C  naproxen (NAPROSYN) 500 MG tablet Take 1 tablet (500 mg total) by mouth 2 (two) times daily. 01/13/18   Maczis, Barth Kirks, PA-C  ONGLYZA 2.5 MG TABS tablet Take 2.5 mg by mouth daily. 01/30/16   [provider]  RA KRILL OIL 500 MG CAPS Take 500 mg by mouth daily.    [provider]  simvastatin (ZOCOR) 20 MG tablet Take 20 mg by mouth daily. 01/24/16   [provider]  vitamin C (ASCORBIC ACID) 500 MG tablet Take 500 mg by mouth daily.    [provider]  Vitamins/Minerals TABS Take 1 tablet by mouth daily.    [provider]    Family History No family history on file.  Social History Social History   Tobacco Use  . Smoking status: Never Smoker  . Smokeless tobacco: Current User    Types: Snuff  Substance Use Topics  . Alcohol use: No  . Drug use: No     Allergies   Hydrocodone, Penicillins, and Oxycodone   Review of Systems Review of  Systems  Constitutional: Positive for appetite change and chills. Negative for activity change and fever.  HENT: Negative for ear pain, rhinorrhea, sneezing and sore throat.   Eyes: Negative for photophobia and visual disturbance.  Respiratory: Positive for cough and shortness of breath. Negative for chest tightness and wheezing.   Cardiovascular: Negative for chest pain and palpitations.  Gastrointestinal: Positive for nausea. Negative for abdominal pain, blood in stool, constipation, diarrhea and vomiting.  Genitourinary: Negative for dysuria, hematuria and urgency.  Musculoskeletal: Positive for myalgias.  Skin: Negative for rash.  Neurological: Negative for dizziness, weakness and light-headedness.     Physical Exam Updated Vital Signs BP (!) 141/84   Pulse (!) 107   Temp 97.8 F (36.6 C)   Resp (!) 21   SpO2 94%   Physical Exam Vitals signs and nursing note reviewed.  Constitutional:      General: He is not in acute distress.    Appearance: He is well-developed. He is obese.     Comments: Speaking in complete sentences.  On 2 L of oxygen via nasal cannula.  HENT:     Head: Normocephalic and atraumatic.     Nose: Nose normal.  Eyes:     General: No scleral icterus.       Left eye: No discharge.     Conjunctiva/sclera: Conjunctivae normal.  Neck:     Musculoskeletal: Normal range of motion and neck supple.  Cardiovascular:     Rate and Rhythm: Normal rate and regular rhythm.     Heart sounds: Normal heart sounds. No murmur. No friction rub. No gallop.   Pulmonary:     Effort: Pulmonary effort is normal. No respiratory distress.     Breath sounds: Normal breath sounds.  Abdominal:     General: Bowel sounds are normal. There is no distension.     Palpations: Abdomen is soft.     Tenderness: There is no abdominal tenderness. There is no guarding.  Musculoskeletal: Normal range of motion.  Skin:    General: Skin is warm and dry.     Findings: No rash.  Neurological:      Mental Status: He is alert.     Motor: No abnormal muscle tone.     Coordination: Coordination normal.      ED Treatments / Results  Labs (all labs ordered are listed, but only abnormal results are displayed) Labs Reviewed  COMPREHENSIVE METABOLIC PANEL - Abnormal; Notable for the following components:      Result Value   Potassium 3.2 (*)    Glucose, Bld 250 (*)    Albumin 3.4 (*)    All other components within normal limits  CBC WITH DIFFERENTIAL/PLATELET - Abnormal; Notable for the following components:   Hemoglobin 17.3 (*)    All other  components within normal limits  CBG MONITORING, ED - Abnormal; Notable for the following components:   Glucose-Capillary 231 (*)    All other components within normal limits  SARS CORONAVIRUS 2 (TAT 6-24 HRS)    EKG None  Radiology Dg Chest Portable 1 View  Result Date: 05/18/2019 CLINICAL DATA:  Shortness of breath EXAM: PORTABLE CHEST 1 VIEW COMPARISON:  April 27, 2016 FINDINGS: Lungs are clear. Heart size and pulmonary vascularity are normal. No adenopathy. No bone lesions. IMPRESSION: No edema or consolidation. Electronically Signed   By: Lowella Grip III M.D.   On: 05/18/2019 10:19    Procedures .Critical Care Performed by: Delia Heady, PA-C Authorized by: Delia Heady, PA-C   Critical care provider statement:    Critical care time (minutes):  35   Critical care time was exclusive of:  Separately billable procedures and treating other patients   Critical care was necessary to treat or prevent imminent or life-threatening deterioration of the following conditions:  Cardiac failure, respiratory failure and sepsis   Critical care was time spent personally by me on the following activities:  Development of treatment plan with patient or surrogate, discussions with consultants, evaluation of patient's response to treatment, examination of patient, review of old charts, pulse oximetry, ordering and review of radiographic  studies, ordering and review of laboratory studies and obtaining history from patient or surrogate   I assumed direction of critical care for this patient from another provider in my specialty: no     (including critical care time)  Medications Ordered in ED Medications  sodium chloride flush (NS) 0.9 % injection 3 mL (has no administration in time range)  amLODipine (NORVASC) tablet 10 mg (10 mg Oral Given 05/18/19 1123)  sodium chloride 0.9 % bolus 1,000 mL (1,000 mLs Intravenous New Bag/Given 05/18/19 1122)     Initial Impression / Assessment and Plan / ED Course  I have reviewed the triage vital signs and the nursing notes.  Pertinent labs & imaging results that were available during my care of the patient were reviewed by me and considered in my medical decision making (see chart for details).        [name] [name] was evaluated in Emergency Department on 05/18/19  for the symptoms described in the history of present illness. He/she was evaluated in the context of the global COVID-19 pandemic, which necessitated consideration that the patient might be at risk for infection with the SARS-CoV-2 virus that causes COVID-19. Institutional protocols and algorithms that pertain to the evaluation of patients at risk for COVID-19 are in a state of rapid change based on information released by regulatory bodies including the CDC and federal and state organizations. These policies and algorithms were followed during the patient's care in the ED.  58 year old male with a past medical history of IDDM, hypertension presenting to the ED with for generalized body aches, intermittent productive cough, shortness of breath, chills and concern for COVID-19 infection.  Symptoms began 3 weeks ago, he tested -2-1/2 weeks ago but his wife has since tested positive for the virus.  His symptoms have progressed over the course of 3 weeks.  Denies any chest pain, leg swelling, history of DVT or PE or  hemoptysis.  Patient oxygen saturations 86 to 88% on room air on arrival, placed on 2 L of oxygen by nasal cannula with improvement to upper 90s.  Lungs are clear to auscultation bilaterally.  Patient initially tachycardic, hypertensive.  Ordered home dose of antihypertensives.  Chest  x-ray is unremarkable.  CBC, CMP unremarkable.  Covid test pending.  Patient ambulated with drop in oxygen saturations to 86 on room air.  Patient will need to be admitted for possible COVID-19 infection and new oxygen requirement. Will consult hospitalist for admission.  Final Clinical Impressions(s) / ED Diagnoses   Final diagnoses:  Shortness of breath  Close exposure to COVID-19 virus    ED Discharge Orders    None      Portions of this note were generated with Dragon dictation software. Dictation errors may occur despite best attempts at proofreading.    Delia Heady, PA-C 05/18/19 1143    Charlesetta Shanks, MD 05/20/19 [phone]

## 2019-05-19 ENCOUNTER — Other Ambulatory Visit: Payer: Self-pay

## 2019-05-19 DIAGNOSIS — E1165 Type 2 diabetes mellitus with hyperglycemia: Secondary | ICD-10-CM

## 2019-05-19 DIAGNOSIS — Z720 Tobacco use: Secondary | ICD-10-CM

## 2019-05-19 DIAGNOSIS — E1140 Type 2 diabetes mellitus with diabetic neuropathy, unspecified: Secondary | ICD-10-CM

## 2019-05-19 LAB — COMPREHENSIVE METABOLIC PANEL
ALT: 28 U/L (ref 0–44)
AST: 22 U/L (ref 15–41)
Albumin: 2.9 g/dL — ABNORMAL LOW (ref 3.5–5.0)
Alkaline Phosphatase: 52 U/L (ref 38–126)
Anion gap: 12 (ref 5–15)
BUN: 18 mg/dL (ref 6–20)
CO2: 25 mmol/L (ref 22–32)
Calcium: 8.7 mg/dL — ABNORMAL LOW (ref 8.9–10.3)
Chloride: 101 mmol/L (ref 98–111)
Creatinine, Ser: 0.81 mg/dL (ref 0.61–1.24)
GFR calc Af Amer: >60 mL/min (ref >60)
GFR calc non Af Amer: >60 mL/min (ref >60)
Glucose, Bld: 302 mg/dL — ABNORMAL HIGH (ref 70–99)
Potassium: 3.8 mmol/L (ref 3.5–5.1)
Sodium: 138 mmol/L (ref 135–145)
Total Bilirubin: 1.2 mg/dL (ref 0.3–1.2)
Total Protein: 6.4 g/dL — ABNORMAL LOW (ref 6.5–8.1)

## 2019-05-19 LAB — CBC WITH DIFFERENTIAL/PLATELET
Abs Immature Granulocytes: 0 10*3/uL (ref 0.00–0.07)
Basophils Absolute: 0 10*3/uL (ref 0.0–0.1)
Basophils Relative: 0 %
Eosinophils Absolute: 0 10*3/uL (ref 0.0–0.5)
Eosinophils Relative: 0 %
HCT: 45.6 % (ref 39.0–52.0)
Hemoglobin: 15.8 g/dL (ref 13.0–17.0)
Lymphocytes Relative: 10 %
Lymphs Abs: 0.5 10*3/uL — ABNORMAL LOW (ref 0.7–4.0)
MCH: 30.9 pg (ref 26.0–34.0)
MCHC: 34.6 g/dL (ref 30.0–36.0)
MCV: 89.1 fL (ref 80.0–100.0)
Monocytes Absolute: 0.1 10*3/uL (ref 0.1–1.0)
Monocytes Relative: 2 %
Neutro Abs: 4.4 10*3/uL (ref 1.7–7.7)
Neutrophils Relative %: 88 %
Platelets: 208 10*3/uL (ref 150–400)
RBC: 5.12 MIL/uL (ref 4.22–5.81)
RDW: 12.4 % (ref 11.5–15.5)
WBC: 5 10*3/uL (ref 4.0–10.5)
nRBC: 0 % (ref 0.0–0.2)
nRBC: 0 /100 WBC

## 2019-05-19 LAB — GLUCOSE, CAPILLARY
Glucose-Capillary: 338 mg/dL — ABNORMAL HIGH (ref 70–99)
Glucose-Capillary: 405 mg/dL — ABNORMAL HIGH (ref 70–99)

## 2019-05-19 LAB — BASIC METABOLIC PANEL
Anion gap: 9 (ref 5–15)
BUN: 21 mg/dL — ABNORMAL HIGH (ref 6–20)
CO2: 27 mmol/L (ref 22–32)
Calcium: 9 mg/dL (ref 8.9–10.3)
Chloride: 101 mmol/L (ref 98–111)
Creatinine, Ser: 0.8 mg/dL (ref 0.61–1.24)
GFR calc Af Amer: >60 mL/min (ref >60)
GFR calc non Af Amer: >60 mL/min (ref >60)
Glucose, Bld: 339 mg/dL — ABNORMAL HIGH (ref 70–99)
Potassium: 4.1 mmol/L (ref 3.5–5.1)
Sodium: 137 mmol/L (ref 135–145)

## 2019-05-19 LAB — D-DIMER, QUANTITATIVE: D-Dimer, Quant: 0.56 ug/mL-FEU — ABNORMAL HIGH (ref 0.00–0.50)

## 2019-05-19 LAB — PHOSPHORUS: Phosphorus: 2.1 mg/dL — ABNORMAL LOW (ref 2.5–4.6)

## 2019-05-19 LAB — MAGNESIUM: Magnesium: 1.6 mg/dL — ABNORMAL LOW (ref 1.7–2.4)

## 2019-05-19 LAB — C-REACTIVE PROTEIN: CRP: 2.7 mg/dL — ABNORMAL HIGH (ref <1.0)

## 2019-05-19 LAB — FERRITIN: Ferritin: 332 ng/mL (ref 24–336)

## 2019-05-19 MED ORDER — INSULIN ASPART 100 UNIT/ML ~~LOC~~ SOLN
5.0000 [IU] | Freq: Three times a day (TID) | SUBCUTANEOUS | Status: DC
  Administered 2019-05-19: 5 [IU] via SUBCUTANEOUS

## 2019-05-19 MED ORDER — POTASSIUM PHOSPHATES 15 MMOLE/5ML IV SOLN
25.0000 mmol | Freq: Once | INTRAVENOUS | Status: AC
  Administered 2019-05-19: 25 mmol via INTRAVENOUS
  Filled 2019-05-19: qty 8.33

## 2019-05-19 MED ORDER — SODIUM CHLORIDE 0.9 % IV SOLN
100.0000 mg | INTRAVENOUS | Status: DC
  Filled 2019-05-19: qty 20

## 2019-05-19 MED ORDER — DEXAMETHASONE SODIUM PHOSPHATE 10 MG/ML IJ SOLN: 6.0000 mg | INTRAMUSCULAR | Status: DC

## 2019-05-19 MED ORDER — ENOXAPARIN SODIUM 80 MG/0.8ML ~~LOC~~ SOLN
70.0000 mg | SUBCUTANEOUS | Status: DC
  Administered 2019-05-19: 70 mg via SUBCUTANEOUS
  Filled 2019-05-19: qty 0.8

## 2019-05-19 MED ORDER — INSULIN ASPART 100 UNIT/ML ~~LOC~~ SOLN
0.0000 [IU] | Freq: Every day | SUBCUTANEOUS | Status: DC
  Administered 2019-05-19: 5 [IU] via SUBCUTANEOUS

## 2019-05-19 MED ORDER — ATORVASTATIN CALCIUM 10 MG PO TABS
20.0000 mg | ORAL_TABLET | Freq: Every day | ORAL | Status: DC
  Administered 2019-05-19: 20 mg via ORAL
  Filled 2019-05-19: qty 2

## 2019-05-19 MED ORDER — ENSURE ENLIVE PO LIQD: 237.0000 mL | Freq: Two times a day (BID) | ORAL | Status: DC

## 2019-05-19 MED ORDER — INSULIN GLARGINE 100 UNIT/ML ~~LOC~~ SOLN
25.0000 [IU] | Freq: Two times a day (BID) | SUBCUTANEOUS | Status: DC
  Administered 2019-05-19 (×2): 25 [IU] via SUBCUTANEOUS
  Filled 2019-05-19 (×4): qty 0.25

## 2019-05-19 MED ORDER — MAGNESIUM SULFATE 2 GM/50ML IV SOLN
2.0000 g | Freq: Once | INTRAVENOUS | Status: AC
  Administered 2019-05-19: 2 g via INTRAVENOUS
  Filled 2019-05-19: qty 50

## 2019-05-19 MED ORDER — ALBUTEROL SULFATE HFA 108 (90 BASE) MCG/ACT IN AERS: 2.0000 | INHALATION_SPRAY | Freq: Four times a day (QID) | RESPIRATORY_TRACT | Status: DC | PRN

## 2019-05-19 MED ORDER — SODIUM CHLORIDE 0.9 % IV SOLN
200.0000 mg | Freq: Once | INTRAVENOUS | Status: AC
  Administered 2019-05-19: 200 mg via INTRAVENOUS
  Filled 2019-05-19: qty 40

## 2019-05-19 MED ORDER — INSULIN ASPART 100 UNIT/ML ~~LOC~~ SOLN
0.0000 [IU] | Freq: Three times a day (TID) | SUBCUTANEOUS | Status: DC
  Administered 2019-05-19: 15 [IU] via SUBCUTANEOUS

## 2019-05-19 NOTE — Progress Notes (Signed)
Patient ID: Kyle [name], male   DOB: 1960/08/07, 58 y.o.   MRN: [id number]  PROGRESS NOTE    Kyle [name]  [id number] DOB: 06-Apr-1961 DOA: 05/18/2019 PCP: Patient, No Pcp Per   Brief Narrative:  58 year old male with history of hypertension, hyperlipidemia, diabetes mellitus type 2, pancreatitis treated with mumps, and nephrolithiasis presented with feeling unwell getting worse over the last 3 weeks.  On presentation, he was found to be hypoxic requiring supplemental oxygen.  He tested Covid positive.  He was started on Decadron and  Assessment & Plan:   Acute hypoxic respiratory failure COVID-19 pneumonia -Patient presented with hypoxia and required supplemental oxygen up to 4 L/min -COVID-19 was positive.  Chest x-ray on presentation was negative.  Procalcitonin was less than 0.1 on presentation -Currently on Decadron -We will also start remdesivir.  Continue vitamin C and zinc COVID-19 Labs  Recent Labs    05/18/19 1430 05/19/19 0455  DDIMER 0.69* 0.56*  FERRITIN 293 332  LDH 299*  --   CRP 3.6* 2.7*    Lab Results  Component Value Date   SARSCOV2NAA POSITIVE (A) 05/18/2019    -Monitor inflammatory markers -We will possibly transfer to [place] hypertension -Blood pressure elevated.  Continue amlodipine and metoprolol.  If persistently remains elevated, will restart benazepril/hydrochlorothiazide which was held because of diarrhea and signs of dehydration  Diabetes mellitus type 2, uncontrolled with hyperglycemia and neuropathy -A1c 10.1 -Sitagliptin-Metformin on hold -Increase Lantus to 25 units twice a day.  Add NovoLog with meals.  Continue CBGs with SSI.  Hypokalemia  -improved  Hypomagnesemia -Replace.  Repeat a.m. labs  Diarrhea -Secondary to Covid.  Improving.  Imodium as needed  Morbid obesity  -Outpatient follow-up.  Smokeless tobacco use -He was counseled regarding cessation of smokeless tobacco use by admitting provider   DVT prophylaxis: Lovenox Code Status: Full Family Communication: Spoke to patient at bedside Disposition Plan: Home once clinically improved and hypoxia resolves and completion of remdesivir.  Possible transfer to [place]  Consultants: None  Procedures: None  Antimicrobials: None   Subjective: Patient seen and examined at bedside.  He feels slightly better.  Cough and shortness of breath is improving.  No overnight fever or vomiting.  States that his energy level is improving.  Objective: Vitals:   05/18/19 2022 05/18/19 2110 05/19/19 0730 05/19/19 0900  BP:  (!) 142/89 (!) 165/97   Pulse:  73 72   Resp:  20 16   Temp:  98.6 F (37 C) 98.1 F (36.7 C)   TempSrc:  Oral Oral   SpO2: 94% 95% 93% 92%  Weight:  (!) 144 kg    Height:  5\' 11"  (1.803 m)      Intake/Output Summary (Last 24 hours) at 05/19/2019 1052 Last data filed at 05/19/2019 0920 Gross per 24 hour  Intake 240 ml  Output 375 ml  Net -135 ml   Filed Weights   05/18/19 2110  Weight: (!) 144 kg    Examination:  General exam: Appears calm and comfortable  Respiratory system: Bilateral decreased breath sounds at bases with some scattered crackles Cardiovascular system: S1 & S2 heard, Rate controlled Gastrointestinal system: Abdomen is morbidly obese, nondistended, soft and nontender. Normal bowel sounds heard. Extremities: No cyanosis, clubbing, edema   Data Reviewed: I have personally reviewed following labs and imaging studies  CBC: Recent Labs  Lab 05/18/19 0944 05/19/19 0455  WBC 7.7 5.0  NEUTROABS 6.0 4.4  HGB 17.3* 15.8  HCT 50.4 45.6  MCV 90.0 89.1  PLT 234 [id number]   Basic Metabolic Panel: Recent Labs  Lab 05/18/19 0944 05/19/19 0455  NA 138 138  K 3.2* 3.8  CL 98 101  CO2 26 25  GLUCOSE 250* 302*  BUN 16 18  CREATININE 1.02 0.81  CALCIUM 9.1 8.7*  MG  --  1.6*  PHOS  --  2.1*   GFR: Estimated Creatinine Clearance: 148.1 mL/min (by C-G formula based on SCr of 0.81 mg/dL). Liver  Function Tests: Recent Labs  Lab 05/18/19 0944 05/19/19 0455  AST 29 22  ALT 29 28  ALKPHOS 63 52  BILITOT 0.9 1.2  PROT 7.1 6.4*  ALBUMIN 3.4* 2.9*   No results for input(s): LIPASE, AMYLASE in the last 168 hours. No results for input(s): AMMONIA in the last 168 hours. Coagulation Profile: No results for input(s): INR, PROTIME in the last 168 hours. Cardiac Enzymes: No results for input(s): CKTOTAL, CKMB, CKMBINDEX, TROPONINI in the last 168 hours. BNP (last 3 results) No results for input(s): PROBNP in the last 8760 hours. HbA1C: Recent Labs    05/18/19 1443  HGBA1C 10.1*   CBG: Recent Labs  Lab 05/18/19 1006 05/18/19 1814 05/18/19 1924 05/18/19 2016 05/18/19 2309  GLUCAP 231* 304* 310* 345* 330*   Lipid Profile: No results for input(s): CHOL, HDL, LDLCALC, TRIG, CHOLHDL, LDLDIRECT in the last 72 hours. Thyroid Function Tests: No results for input(s): TSH, T4TOTAL, FREET4, T3FREE, THYROIDAB in the last 72 hours. Anemia Panel: Recent Labs    05/18/19 1430 05/19/19 0455  FERRITIN 293 332   Sepsis Labs: Recent Labs  Lab 05/18/19 1430  PROCALCITON <0.10    Recent Results (from the past 240 hour(s))  SARS CORONAVIRUS 2 (TAT 6-24 HRS) Nasopharyngeal Nasopharyngeal Swab     Status: Abnormal   Collection Time: 05/18/19 10:26 AM   Specimen: Nasopharyngeal Swab  Result Value Ref Range Status   SARS Coronavirus 2 POSITIVE (A) NEGATIVE Final    Comment: RESULT CALLED TO, READ BACK BY AND VERIFIED WITH: P.JOHNSTON RN 1657 05/18/2019 MCCORMICK K (NOTE) SARS-CoV-2 target nucleic acids are DETECTED. The SARS-CoV-2 RNA is generally detectable in upper and lower respiratory specimens during the acute phase of infection. Positive results are indicative of active infection with SARS-CoV-2. Clinical  correlation with patient history and other diagnostic information is necessary to determine patient infection status. Positive results do  not rule out bacterial  infection or co-infection with other viruses. The expected result is Negative. Fact Sheet for Patients: SugarRoll.be Fact Sheet for Healthcare Providers: https://www.woods-mathews.com/ This test is not yet approved or cleared by the [place] FDA and  has been authorized for detection and/or diagnosis of SARS-CoV-2 by FDA under an Emergency Use Authorization (EUA). This EUA will remain  in effect (meaning this test can be used)  for the duration of the COVID-19 declaration under Section 564(b)(1) of the Act, 21 U.S.C. section 360bbb-3(b)(1), unless the authorization is terminated or revoked sooner. Performed at [facility], [place] [address], [place], [place] [address]          Radiology Studies: Dg Chest Portable 1 View  Result Date: 05/18/2019 CLINICAL DATA:  Shortness of breath EXAM: PORTABLE CHEST 1 VIEW COMPARISON:  April 27, 2016 FINDINGS: Lungs are clear. Heart size and pulmonary vascularity are normal. No adenopathy. No bone lesions. IMPRESSION: No edema or consolidation. Electronically Signed   By: Lowella Grip III M.D.   On: 05/18/2019 10:19        Scheduled Meds: .  albuterol  2 puff Inhalation Q6H  . amLODipine  5 mg Oral Daily  . atorvastatin  20 mg Oral q1800  . cholecalciferol  1,000 Units Oral Daily  . dexamethasone (DECADRON) injection  6 mg Intravenous Q24H  . enoxaparin (LOVENOX) injection  70 mg Subcutaneous Q24H  . famotidine  20 mg Oral BID  . gabapentin  1,200 mg Oral BID  . insulin aspart  0-20 Units Subcutaneous TID WC  . insulin aspart  0-5 Units Subcutaneous QHS  . insulin aspart  5 Units Subcutaneous TID WC  . insulin glargine  25 Units Subcutaneous BID  . metoprolol tartrate  50 mg Oral Daily  . sodium chloride flush  3 mL Intravenous Once  . vitamin C  500 mg Oral Daily  . zinc sulfate  220 mg Oral Daily   Continuous Infusions:        Aline August, MD [facility]  05/19/2019, 10:52 AM

## 2019-05-19 NOTE — Progress Notes (Addendum)
Pt O2 levels 92% on room air, ambulated patient on room air pt sating 93/94%

## 2019-05-20 DIAGNOSIS — Z20828 Contact with and (suspected) exposure to other viral communicable diseases: Secondary | ICD-10-CM

## 2019-05-20 LAB — GLUCOSE, CAPILLARY: Glucose-Capillary: 251 mg/dL — ABNORMAL HIGH (ref 70–99)

## 2019-05-20 LAB — CBC WITH DIFFERENTIAL/PLATELET
Abs Immature Granulocytes: 0.07 10*3/uL (ref 0.00–0.07)
Basophils Absolute: 0 10*3/uL (ref 0.0–0.1)
Basophils Relative: 0 %
Eosinophils Absolute: 0 10*3/uL (ref 0.0–0.5)
Eosinophils Relative: 0 %
HCT: 47.1 % (ref 39.0–52.0)
Hemoglobin: 15.6 g/dL (ref 13.0–17.0)
Immature Granulocytes: 1 %
Lymphocytes Relative: 15 %
Lymphs Abs: 1 10*3/uL (ref 0.7–4.0)
MCH: 30.2 pg (ref 26.0–34.0)
MCHC: 33.1 g/dL (ref 30.0–36.0)
MCV: 91.1 fL (ref 80.0–100.0)
Monocytes Absolute: 0.6 10*3/uL (ref 0.1–1.0)
Monocytes Relative: 10 %
Neutro Abs: 4.7 10*3/uL (ref 1.7–7.7)
Neutrophils Relative %: 74 %
Platelets: 253 10*3/uL (ref 150–400)
RBC: 5.17 MIL/uL (ref 4.22–5.81)
RDW: 12.6 % (ref 11.5–15.5)
WBC: 6.3 10*3/uL (ref 4.0–10.5)
nRBC: 0 % (ref 0.0–0.2)

## 2019-05-20 LAB — C-REACTIVE PROTEIN: CRP: 1 mg/dL — ABNORMAL HIGH (ref <1.0)

## 2019-05-20 LAB — COMPREHENSIVE METABOLIC PANEL
ALT: 26 U/L (ref 0–44)
AST: 24 U/L (ref 15–41)
Albumin: 3.2 g/dL — ABNORMAL LOW (ref 3.5–5.0)
Alkaline Phosphatase: 56 U/L (ref 38–126)
Anion gap: 9 (ref 5–15)
BUN: 22 mg/dL — ABNORMAL HIGH (ref 6–20)
CO2: 29 mmol/L (ref 22–32)
Calcium: 8.9 mg/dL (ref 8.9–10.3)
Chloride: 102 mmol/L (ref 98–111)
Creatinine, Ser: 0.76 mg/dL (ref 0.61–1.24)
GFR calc Af Amer: >60 mL/min (ref >60)
GFR calc non Af Amer: >60 mL/min (ref >60)
Glucose, Bld: 237 mg/dL — ABNORMAL HIGH (ref 70–99)
Potassium: 4.3 mmol/L (ref 3.5–5.1)
Sodium: 140 mmol/L (ref 135–145)
Total Bilirubin: 0.4 mg/dL (ref 0.3–1.2)
Total Protein: 6.6 g/dL (ref 6.5–8.1)

## 2019-05-20 LAB — HEMOGLOBIN A1C
Hgb A1c MFr Bld: 10 % — ABNORMAL HIGH (ref 4.8–5.6)
Mean Plasma Glucose: 240.3 mg/dL

## 2019-05-20 LAB — BRAIN NATRIURETIC PEPTIDE: B Natriuretic Peptide: 76.5 pg/mL (ref 0.0–100.0)

## 2019-05-20 LAB — D-DIMER, QUANTITATIVE: D-Dimer, Quant: 0.57 ug/mL-FEU — ABNORMAL HIGH (ref 0.00–0.50)

## 2019-05-20 LAB — MAGNESIUM: Magnesium: 2 mg/dL (ref 1.7–2.4)

## 2019-05-20 NOTE — Progress Notes (Signed)
Inpatient Diabetes Program Recommendations  AACE/ADA: New Consensus Statement on Inpatient Glycemic Control   Target Ranges:  Prepandial:   less than 140 mg/dL      Peak postprandial:   less than 180 mg/dL (1-2 hours)      Critically ill patients:  140 - 180 mg/dL   Results for MORDECAI, OO (MRN [id number]) as of 05/20/2019 08:11  Ref. Range 05/19/2019 11:27 05/19/2019 22:04  Glucose-Capillary Latest Ref Range: 70 - 99 mg/dL 338 (H) 405 (H)   Review of Glycemic Control  Diabetes history: DM2 Outpatient Diabetes medications: Basaglar 20 units BID, Janumet XR [phone] QHS Current orders for Inpatient glycemic control: Lantus 25 units BID, Novolog 0-20 units TID with meals, Novolog 0-5 units QHS, Novolog 5 units TID with meals for meal coverage; Decadron 6 mg Q24H  Inpatient Diabetes Program Recommendations:   Insulin-Basal: Please consider increasing Lantus to 35 units BID.  Insulin-Meal Coverage: Please consider increasing meal coverage to Novolog 10 units TID with meals for meal coverage if patient eats at least 50% of meals.  Thanks, Barnie Alderman, RN, MSN, CDE Diabetes Coordinator Inpatient Diabetes Program [phone] (Team Pager from 8am to 5pm)

## 2019-05-20 NOTE — Discharge Summary (Signed)
PATIENT DETAILS Name: Kyle [name] Age: 58 y.o. Sex: male Date of Birth: 08/13/1960 MRN: [id number]. Admitting Physician: Norval Morton, MD [id number], No Pcp Per  Admit Date: 05/18/2019 Discharge date: 05/20/2019  Note:patient left AMA            Recommendations for Outpatient Follow-up:  1. Repeat chest x-ray in 4 to 6 weeks 2. Please repeat CBC/BMET at next visit   PRIMARY DISCHARGE DIAGNOSIS:  Principal Problem:   Acute respiratory failure with hypoxia ([place]) Active Problems:   COVID-19 virus infection   Type 2 diabetes mellitus, uncontrolled, with neuropathy (HCC)   Hypokalemia   Smokeless tobacco use   Essential hypertension      PAST MEDICAL HISTORY: Past Medical History:  Diagnosis Date   Diabetes mellitus without complication (HCC)    Elevated cholesterol    GERD (gastroesophageal reflux disease)    History of kidney stones 2013   Hypertension    Pancreatitis age 70   with mumps, no problems since   PONV (postoperative nausea and vomiting) yrs ago    ALLERGIES:   Allergies  Allergen Reactions   Hydrocodone     Other reaction(s): Hallucinations   Penicillins Rash    Has patient had a PCN reaction causing immediate rash, facial/tongue/throat swelling, SOB or lightheadedness with hypotension: YES Has patient had a PCN reaction causing severe rash involving mucus membranes or skin necrosis: NO Has patient had a PCN reaction that required hospitalization: NO Has patient had a PCN reaction occurring within the last 10 years: NO If all of the above answers are "NO", then may proceed with Cephalosporin use.   Oxycodone Other (See Comments)    Hallucinations    BRIEF HPI:  See H&P, Labs, Consult and Test reports for all details in brief, patient is a 58 year old male with history of HTN, dyslipidemia, DM-2, pancreatitis who presented with a 3-week history of cough, worsening shortness of breath-he was found to have acute hypoxic  respiratory failure secondary to COVID-19 pneumonia.  He was admitted and started on steroids and remdesivir.  He was subsequently transferred to [name] for further evaluation and treatment.  BRIEF HOSPITAL COURSE:  Acute hypoxic respiratory failure secondary to COVID-19 pneumonia: Treated with steroids and remdesivir-he feels much better today-unfortunately he has now decided to sign out [name].  He claims that he did not get appropriate nursing care yesterday-and is really concerned with steroid-induced hyperglycemia.  This MD was called to bedside by RN-I explained the treatment process-and that we were adjusting medications for hyperglycemia-but he refused to remain hospitalized any further.  He was aware of the life-threatening and life disabling effects of leaving AMA.  He came but he was going to [name].  DM-2 with uncontrolled hyperglycemia (A1c 10.1):-CBGs uncontrolled as patient was on steroids-medications were plan to be adjusted-however patient left AMA.  HTN: Sweating-elevated this morning-medications were to be adjusted-however patient left AMA.                                          Patient at this time expresses desire to leave the Hospital immidiately, patient has been warned that this is not Medically advisable at this time, and can result in Medical complications like Death and Disability, patient understands and accepts the risks involved and assumes full responsibilty of this decision.   Oren Binet M.D on 05/20/2019  at 10:17 AM  [facility]  Time < 30 minutes  Last Note Below   CONSULTATIONS:   None  PERTINENT RADIOLOGIC STUDIES: Dg Chest Portable 1 View  Result Date: 05/18/2019 CLINICAL DATA:  Shortness of breath EXAM: PORTABLE CHEST 1 VIEW COMPARISON:  April 27, 2016 FINDINGS: Lungs are clear. Heart size and pulmonary vascularity are normal. No adenopathy. No bone lesions. IMPRESSION: No edema  or consolidation. Electronically Signed   By: Lowella Grip III M.D.   On: 05/18/2019 10:19     PERTINENT LAB RESULTS: CBC: Recent Labs    05/19/19 0455 05/20/19 0440  WBC 5.0 6.3  HGB 15.8 15.6  HCT 45.6 47.1  PLT 208 253   CMET CMP     Component Value Date/Time   NA 140 05/20/2019 0440   K 4.3 05/20/2019 0440   CL 102 05/20/2019 0440   CO2 29 05/20/2019 0440   GLUCOSE 237 (H) 05/20/2019 0440   BUN 22 (H) 05/20/2019 0440   CREATININE 0.76 05/20/2019 0440   CALCIUM 8.9 05/20/2019 0440   PROT 6.6 05/20/2019 0440   ALBUMIN 3.2 (L) 05/20/2019 0440   AST 24 05/20/2019 0440   ALT 26 05/20/2019 0440   ALKPHOS 56 05/20/2019 0440   BILITOT 0.4 05/20/2019 0440   GFRNONAA >60 05/20/2019 0440   GFRAA >60 05/20/2019 0440    GFR Estimated Creatinine Clearance: 149.5 mL/min (by C-G formula based on SCr of 0.76 mg/dL). No results for input(s): LIPASE, AMYLASE in the last 72 hours. No results for input(s): CKTOTAL, CKMB, CKMBINDEX, TROPONINI in the last 72 hours. Invalid input(s): POCBNP Recent Labs    05/19/19 0455 05/20/19 0440  DDIMER 0.56* 0.57*   Recent Labs    05/18/19 1443 05/20/19 0440  HGBA1C 10.1* 10.0*   No results for input(s): CHOL, HDL, LDLCALC, TRIG, CHOLHDL, LDLDIRECT in the last 72 hours. No results for input(s): TSH, T4TOTAL, T3FREE, THYROIDAB in the last 72 hours.  Invalid input(s): FREET3 Recent Labs    05/18/19 1430 05/19/19 0455  FERRITIN 293 332   Coags: No results for input(s): INR in the last 72 hours.  Invalid input(s): PT Microbiology: Recent Results (from the past 240 hour(s))  SARS CORONAVIRUS 2 (TAT 6-24 HRS) Nasopharyngeal Nasopharyngeal Swab     Status: Abnormal   Collection Time: 05/18/19 10:26 AM   Specimen: Nasopharyngeal Swab  Result Value Ref Range Status   SARS Coronavirus 2 POSITIVE (A) NEGATIVE Final    Comment: RESULT CALLED TO, READ BACK BY AND VERIFIED WITH: P.JOHNSTON RN 1657 05/18/2019 MCCORMICK  K (NOTE) SARS-CoV-2 target nucleic acids are DETECTED. The SARS-CoV-2 RNA is generally detectable in upper and lower respiratory specimens during the acute phase of infection. Positive results are indicative of active infection with SARS-CoV-2. Clinical  correlation with patient history and other diagnostic information is necessary to determine patient infection status. Positive results do  not rule out bacterial infection or co-infection with other viruses. The expected result is Negative. Fact Sheet for Patients: SugarRoll.be Fact Sheet for Healthcare Providers: https://www.woods-mathews.com/ This test is not yet approved or cleared by the [place] FDA and  has been authorized for detection and/or diagnosis of SARS-CoV-2 by FDA under an Emergency Use Authorization (EUA). This EUA will remain  in effect (meaning this test can be used)  for the duration of the COVID-19 declaration under Section 564(b)(1) of the Act, 21 U.S.C. section 360bbb-3(b)(1), unless the authorization is terminated or revoked sooner. Performed at [facility], [place]  [address], [place], [place] [address]        TODAY-DAY OF DISCHARGE:  Subjective:   Kyle [name] today has signed out against medical advice. He was warned about the life threatening and life disabling effects by MD or RN.   Objective:   Blood pressure (!) 132/91, pulse 66, temperature 97.9 F (36.6 C), temperature source Oral, resp. rate 19, height 5\' 11"  (1.803 m), weight (!) 143.4 kg, SpO2 94 %.   DISCHARGE CONDITION: Not stable for discharge-left AMA  DISPOSITION: AMA   Follow with your PCP in 1 week   Total Time spent on discharge equals 25  minutes.  SignedOren Binet 05/20/2019 10:11 AM

## 2019-05-20 NOTE — Progress Notes (Signed)
Pt states that he wants to leave AMA. Pt in hallway and states that his wife is on the way to pick him up. Pt asked to return to room so MD can speak with him. MD notified and states he is on the way. MD at bedside to explain to patient process of plan of care. Pt states he "feels better" and he is "concerned about my sugars and blood pressure being all out of whack". MD attempting to explain to pt plan of care and progression of Covid. Pt appears agitated and keeps saying he wants to leave. This RN and MD reviewed risks of leaving AMA including death. Pt states that he wants to leave because a "friend told me that I need to get out of here before I die". Pt provided AMA form and educated on quarantine time period, need to wear a mask per health dept. Pt verbalizes understanding and signed AMA form with this RN as witness. Charge RN Jarrett Soho walked pt to front door. IV removed by pt.

## 2021-03-23 IMAGING — DX DG CHEST 1V PORT
1 series · 1 of 1 positions shown · non-contrast
Comparison: April 27, 2016

CLINICAL DATA: Shortness of breath

EXAM:
PORTABLE CHEST 1 VIEW

[chest ap]
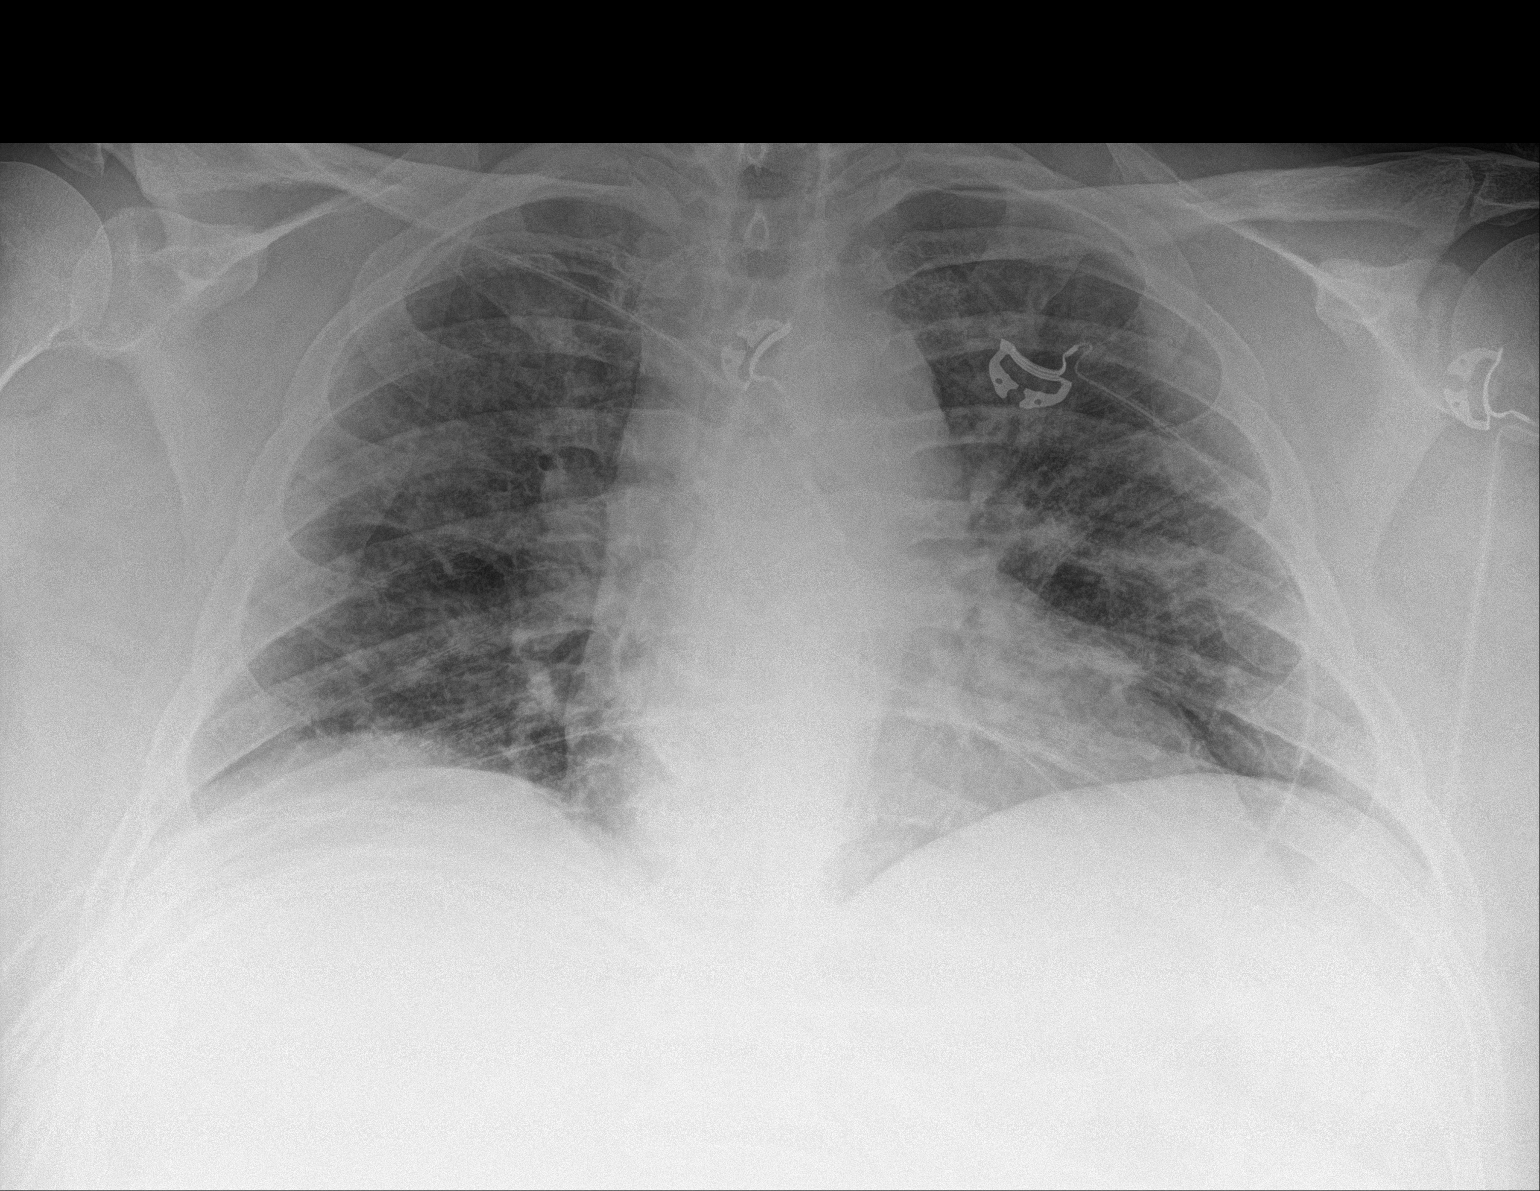

[1 of 1 positions shown; findings below may reference images not displayed]

FINDINGS: Lungs are clear. Heart size and pulmonary vascularity are normal. No
adenopathy. No bone lesions.
IMPRESSION: No edema or consolidation.

## 2021-05-29 ENCOUNTER — Emergency Department (HOSPITAL_COMMUNITY)
Admission: EM | Admit: 2021-05-29 | Discharge: 2021-05-29 | Disposition: A | Discharge status: Home / Self Care | Payer: BC Managed Care – PPO | Attending: Emergency Medicine | Admitting: Emergency Medicine

## 2021-05-29 DIAGNOSIS — Z79899 Other long term (current) drug therapy: Secondary | ICD-10-CM | POA: Diagnosis not present

## 2021-05-29 DIAGNOSIS — R110 Nausea: Secondary | ICD-10-CM | POA: Diagnosis not present

## 2021-05-29 DIAGNOSIS — Z7984 Long term (current) use of oral hypoglycemic drugs: Secondary | ICD-10-CM | POA: Insufficient documentation

## 2021-05-29 DIAGNOSIS — B49 Unspecified mycosis: Secondary | ICD-10-CM

## 2021-05-29 DIAGNOSIS — R5381 Other malaise: Secondary | ICD-10-CM | POA: Diagnosis not present

## 2021-05-29 DIAGNOSIS — B359 Dermatophytosis, unspecified: Secondary | ICD-10-CM | POA: Insufficient documentation

## 2021-05-29 DIAGNOSIS — Z794 Long term (current) use of insulin: Secondary | ICD-10-CM | POA: Diagnosis not present

## 2021-05-29 DIAGNOSIS — I10 Essential (primary) hypertension: Secondary | ICD-10-CM | POA: Insufficient documentation

## 2021-05-29 DIAGNOSIS — R531 Weakness: Secondary | ICD-10-CM | POA: Insufficient documentation

## 2021-05-29 DIAGNOSIS — Z8616 Personal history of COVID-19: Secondary | ICD-10-CM | POA: Insufficient documentation

## 2021-05-29 DIAGNOSIS — R21 Rash and other nonspecific skin eruption: Secondary | ICD-10-CM | POA: Diagnosis present

## 2021-05-29 DIAGNOSIS — E1140 Type 2 diabetes mellitus with diabetic neuropathy, unspecified: Secondary | ICD-10-CM | POA: Insufficient documentation

## 2021-05-29 LAB — COMPREHENSIVE METABOLIC PANEL
ALT: 26 U/L (ref 0–44)
AST: 28 U/L (ref 15–41)
Albumin: 4.1 g/dL (ref 3.5–5.0)
Alkaline Phosphatase: 71 U/L (ref 38–126)
Anion gap: 14 (ref 5–15)
BUN: 16 mg/dL (ref 6–20)
CO2: 26 mmol/L (ref 22–32)
Calcium: 10.7 mg/dL — ABNORMAL HIGH (ref 8.9–10.3)
Chloride: 99 mmol/L (ref 98–111)
Creatinine, Ser: 1.12 mg/dL (ref 0.61–1.24)
GFR, Estimated: >60 mL/min (ref >60)
Glucose, Bld: 172 mg/dL — ABNORMAL HIGH (ref 70–99)
Potassium: 3.5 mmol/L (ref 3.5–5.1)
Sodium: 139 mmol/L (ref 135–145)
Total Bilirubin: 1.7 mg/dL — ABNORMAL HIGH (ref 0.3–1.2)
Total Protein: 7.6 g/dL (ref 6.5–8.1)

## 2021-05-29 LAB — CBC
HCT: 55 % — ABNORMAL HIGH (ref 39.0–52.0)
Hemoglobin: 18.8 g/dL — ABNORMAL HIGH (ref 13.0–17.0)
MCH: 31.3 pg (ref 26.0–34.0)
MCHC: 34.2 g/dL (ref 30.0–36.0)
MCV: 91.7 fL (ref 80.0–100.0)
Platelets: 240 10*3/uL (ref 150–400)
RBC: 6 MIL/uL — ABNORMAL HIGH (ref 4.22–5.81)
RDW: 12.8 % (ref 11.5–15.5)
WBC: 9.5 10*3/uL (ref 4.0–10.5)
nRBC: 0 % (ref 0.0–0.2)

## 2021-05-29 LAB — URINALYSIS, ROUTINE W REFLEX MICROSCOPIC
Bacteria, UA: NONE SEEN
Bilirubin Urine: NEGATIVE
Glucose, UA: >=500 mg/dL — AB
Hgb urine dipstick: NEGATIVE
Ketones, ur: 20 mg/dL — AB
Leukocytes,Ua: NEGATIVE
Nitrite: NEGATIVE
Protein, ur: 30 mg/dL — AB
Specific Gravity, Urine: 1.036 — ABNORMAL HIGH (ref 1.005–1.030)
pH: 5 (ref 5.0–8.0)

## 2021-05-29 LAB — LIPASE, BLOOD: Lipase: 24 U/L (ref 11–51)

## 2021-05-29 LAB — CBG MONITORING, ED: Glucose-Capillary: 190 mg/dL — ABNORMAL HIGH (ref 70–99)

## 2021-05-29 MED ORDER — GABAPENTIN 300 MG PO CAPS: ORAL_CAPSULE | ORAL | 0 refills | Status: AC

## 2021-05-29 MED ORDER — CLOTRIMAZOLE 1 % EX CREA: TOPICAL_CREAM | CUTANEOUS | 0 refills | Status: DC

## 2021-05-29 NOTE — Discharge Instructions (Signed)
We are starting you back on a tapering dose of gabapentin.  We can only give you 2 weeks worth.  I recommend decreasing the gabapentin dose by 600 mg, every week.  We are also going to treat you for fungal infection of the abdominal wall, that is likely secondary to your diabetes.  Call your PCP for follow-up appointment to get further care and treatment within a week or 10 days.

## 2021-05-29 NOTE — ED Provider Notes (Signed)
[facility] EMERGENCY DEPARTMENT Provider Note   CSN: [id number] Arrival date & time: 05/29/21  3662     History Chief Complaint  Patient presents with   Nausea   Weakness   Emesis    Kyle [name] is a 60 y.o. male.  HPI He complains of not feeling well for several weeks.  He stopped taking gabapentin, "cold [place]," when he ran out of it, 3 weeks ago.  He had previously tapered down from 12-8 300 mg tablets daily, prior to that.  Since then he has noted generalized achiness, a feeling of ill-ease, and having a rash under his panniculus.  He was also recently started on Trulicity, 3 weeks ago.  He continues to take twice a day insulin.  He states that he has mild elevation of both early morning CBGs as well as postprandial mid afternoon CBGs.  He shows me a chart indicating numbers in the mid 100s to low 200s.  He is here with his wife who helps to give history.  Yesterday he had trouble feeling weak while trying to fix a fence problems, then began vomiting.  Today he has had nausea but not vomited.  He is not having any diarrhea or abdominal pain.  There are no other known active modifying factors.    Past Medical History:  Diagnosis Date   Diabetes mellitus without complication (HCC)    Elevated cholesterol    GERD (gastroesophageal reflux disease)    History of kidney stones 2013   Hypertension    Pancreatitis age 54   with mumps, no problems since   PONV (postoperative nausea and vomiting) yrs ago    Patient Active Problem List   Diagnosis Date Noted   Acute respiratory failure with hypoxia ([place]) 05/18/2019   COVID-19 virus infection 05/18/2019   Type 2 diabetes mellitus, uncontrolled, with neuropathy 05/18/2019   Hypokalemia 05/18/2019   Smokeless tobacco use 05/18/2019   Essential hypertension 05/18/2019    Past Surgical History:  Procedure Laterality Date   ankle surgery for fx Right 24 yrs ago   COLONOSCOPY WITH PROPOFOL N/A 02/20/2016    Procedure: COLONOSCOPY WITH PROPOFOL;  Surgeon: Garlan Fair, MD;  Location: [facility];  Service: Endoscopy;  Laterality: N/A;   cyst removed between legs  11/2013   EXTRACORPOREAL SHOCK WAVE LITHOTRIPSY  2013       Family History  Problem Relation Age of Onset   Pancreatic cancer Mother    Alzheimer's disease Father     Social History   Tobacco Use   Smoking status: Never   Smokeless tobacco: Current    Types: Snuff  Substance Use Topics   Alcohol use: No   Drug use: No    Home Medications Prior to Admission medications   Medication Sig Start Date End Date Taking? Authorizing Provider  amLODipine (NORVASC) 10 MG tablet Take 10 mg by mouth daily. 04/25/21  Yes [provider]  brimonidine (ALPHAGAN) 0.2 % ophthalmic solution Place 1 drop into the right eye 3 (three) times daily. 02/08/21  Yes [provider]  clotrimazole (LOTRIMIN) 1 % cream Apply to affected area . Low abdomen, 2 times daily 05/29/21  Yes Daleen Bo, MD  dorzolamide-timolol (COSOPT) 22.3-6.8 MG/ML ophthalmic solution Place 1 drop into the right eye 2 (two) times daily.   Yes [provider]  gabapentin (NEURONTIN) 300 MG capsule 4 tablets twice a day, for 1 week, then decrease to 3 tablets twice a day for 1 week 05/29/21  Yes Daleen Bo, MD  Insulin Glargine (BASAGLAR KWIKPEN) 100 UNIT/ML SOPN Inject 30 Units into the skin 2 (two) times daily. 04/22/19  Yes [provider]  JARDIANCE 25 MG TABS tablet Take 25 mg by mouth daily. 04/13/21  Yes [provider]  ketorolac (ACULAR) 0.5 % ophthalmic solution Place 1 drop into the right eye daily.   Yes [provider]  latanoprost (XALATAN) 0.005 % ophthalmic solution Place 1 drop into the right eye at bedtime. 05/04/21  Yes [provider]  metFORMIN (GLUCOPHAGE) 1000 MG tablet Take 1,000 mg by mouth daily. 04/13/21  Yes [provider]  metoprolol tartrate (LOPRESSOR) 50 MG tablet Take  50 mg by mouth daily. 10/03/16  Yes [provider]  olmesartan-hydrochlorothiazide (BENICAR HCT) 40-25 MG tablet Take 1 tablet by mouth daily.   Yes [provider]  RA KRILL OIL 500 MG CAPS Take 500 mg by mouth daily.   Yes [provider]  simvastatin (ZOCOR) 40 MG tablet Take 40 mg by mouth daily at 6 PM.  01/24/16  Yes [provider]  TRULICITY 4.5 UE/2.8MK SOPN Inject 4.5 mg into the skin once a week. 05/22/21  Yes [provider]  vitamin B-12 (CYANOCOBALAMIN) 500 MCG tablet Take 500 mcg by mouth daily.   Yes [provider]  Vitamins/Minerals TABS Take 1 tablet by mouth daily. Calcium, magnesium & Zinc   Yes [provider]  lidocaine (LIDODERM) 5 % Place 1 patch onto the skin daily. Remove & Discard patch within 12 hours or as directed by MD Patient not taking: No sig reported 01/13/18   Maczis, Barth Kirks, PA-C  methocarbamol (ROBAXIN) 500 MG tablet Take 1 tablet (500 mg total) by mouth 2 (two) times daily. Patient not taking: No sig reported 01/13/18   Maczis, Barth Kirks, PA-C  naproxen (NAPROSYN) 500 MG tablet Take 1 tablet (500 mg total) by mouth 2 (two) times daily. Patient not taking: No sig reported 01/13/18   Jillyn Ledger, PA-C    Allergies    Acetazolamide, Hydrocodone, Penicillins, and Oxycodone  Review of Systems   Review of Systems  All other systems reviewed and are negative.  Physical Exam Updated Vital Signs BP (!) 169/106   Pulse (!) 56   Temp 98.1 F (36.7 C)   Resp 12   SpO2 94%   Physical Exam Vitals and nursing note reviewed.  Constitutional:      General: He is not in acute distress.    Appearance: He is well-developed. He is obese. He is not ill-appearing, toxic-appearing or diaphoretic.  HENT:     Head: Normocephalic and atraumatic.     Right Ear: External ear normal.     Left Ear: External ear normal.  Eyes:     Conjunctiva/sclera: Conjunctivae normal.     Pupils: Pupils are equal,  round, and reactive to light.  Neck:     Trachea: Phonation normal.  Cardiovascular:     Rate and Rhythm: Normal rate and regular rhythm.     Heart sounds: Normal heart sounds.  Pulmonary:     Effort: Pulmonary effort is normal.     Breath sounds: Normal breath sounds.  Abdominal:     General: There is no distension.     Palpations: Abdomen is soft.     Tenderness: There is no abdominal tenderness.  Musculoskeletal:        General: Normal range of motion.     Cervical back: Normal range of motion and neck  supple.  Skin:    General: Skin is warm and dry.  Neurological:     Mental Status: He is alert and oriented to person, place, and time.     Cranial Nerves: No cranial nerve deficit.     Sensory: No sensory deficit.     Motor: No abnormal muscle tone.     Coordination: Coordination normal.  Psychiatric:        Mood and Affect: Mood normal.        Behavior: Behavior normal.        Thought Content: Thought content normal.        Judgment: Judgment normal.    ED Results / Procedures / Treatments   Labs (all labs ordered are listed, but only abnormal results are displayed) Labs Reviewed  COMPREHENSIVE METABOLIC PANEL - Abnormal; Notable for the following components:      Result Value   Glucose, Bld 172 (*)    Calcium 10.7 (*)    Total Bilirubin 1.7 (*)    All other components within normal limits  CBC - Abnormal; Notable for the following components:   RBC 6.00 (*)    Hemoglobin 18.8 (*)    HCT 55.0 (*)    All other components within normal limits  URINALYSIS, ROUTINE W REFLEX MICROSCOPIC - Abnormal; Notable for the following components:   Specific Gravity, Urine 1.036 (*)    Glucose, UA >=500 (*)    Ketones, ur 20 (*)    Protein, ur 30 (*)    All other components within normal limits  CBG MONITORING, ED - Abnormal; Notable for the following components:   Glucose-Capillary 190 (*)    All other components within normal limits  LIPASE, BLOOD     EKG None  Radiology No results found.  Procedures Procedures   Medications Ordered in ED Medications - No data to display  ED Course  I have reviewed the triage vital signs and the nursing notes.  Pertinent labs & imaging results that were available during my care of the patient were reviewed by me and considered in my medical decision making (see chart for details).    MDM Rules/Calculators/A&P                            Patient Vitals for the past 24 hrs:  BP Temp Temp src Pulse Resp SpO2  05/29/21 1600 (!) 169/106 98.1 F (36.7 C) -- (!) 56 12 94 %  05/29/21 1530 (!) 172/115 -- -- 62 11 94 %  05/29/21 1430 (!) 166/99 98.2 F (36.8 C) -- 79 15 96 %  05/29/21 1133 (!) 157/102 -- -- 74 (!) 22 94 %  05/29/21 0757 (!) 175/97 98.4 F (36.9 C) Oral 88 20 93 %    4:29 PM Reevaluation with update and discussion. After initial assessment and treatment, an updated evaluation reveals no further complaints.  Findings discussed with patient and wife, questions answered. Daleen Bo   Medical Decision Making:  This patient is presenting for evaluation of malaise, which does require a range of treatment options, and is a complaint that involves a moderate risk of morbidity and mortality. The differential diagnoses include complications of diabetes, withdrawal from gabapentin, acute metabolic disorder, infectious process. I decided to review old records, and in summary millage male, recently had medication adjustment by PCP and was taken off gabapentin which she has been on chronically for neuropathy.  He has history of GERD, pancreatitis, hypertension and  diabetes.  I obtained additional historical information from wife at bedside.  Clinical Laboratory Tests Ordered, included CBC, Metabolic panel, and Urinalysis. Review indicates normal except hemoglobin high, glucose high, calcium high, ketones elevated, protein high, urine specific gravity high.   Critical  Interventions-clinical evaluation, laboratory testing, conversation with patient and wife  After These Interventions, the Patient was reevaluated and was found stable for discharge.  Mild hyperglycemia.  Patient having signs and symptoms consistent with withdrawal from gabapentin.  He requires reinitiation of gabapentin therapy with gradual taper following.  I have written initial prescription for 2 weeks of gabapentin restarting his dose of 1200 mg twice daily, decreasing by 600 mg/day, each week.  I encouraged him to follow-up with his PCP for further care and treatment.  He has an incidental fungal infection of the abdominal wall.  Doubt other diabetes complication.  He does not require hospitalization.  CRITICAL CARE-no Performed by: Daleen Bo  Nursing Notes Reviewed/ Care Coordinated Applicable Imaging Reviewed Interpretation of Laboratory Data incorporated into ED treatment  The patient appears reasonably screened and/or stabilized for discharge and I doubt any other medical condition or other [facility] requiring further screening, evaluation, or treatment in the ED at this time prior to discharge.  Plan: Home Medications-continue usual, restart gabapentin; Home Treatments-rest, fluids; return here if the recommended treatment, does not improve the symptoms; Recommended follow up-PCP follow-up care as soon as possible for ongoing gabapentin tapering and close observation.     Final Clinical Impression(s) / ED Diagnoses Final diagnoses:  Malaise  Fungal infection    Rx / DC Orders ED Discharge Orders          Ordered    gabapentin (NEURONTIN) 300 MG capsule        05/29/21 1627    clotrimazole (LOTRIMIN) 1 % cream        05/29/21 1627             Daleen Bo, MD 05/29/21 2110

## 2021-05-29 NOTE — ED Triage Notes (Signed)
Pt. Stated, Kyle [name] being really sick and not feeling good for the last few days. Ive changed Doctors and my medication in the last 3 weeks. I was changed to Trulcity and stop taking Gabapentin cold [place]. I was retaining fluid and this new doctor got rid of it. But my sugar levels are high after fasting. Started having N/V last weekend  and then it started back again.

## 2021-08-21 ENCOUNTER — Other Ambulatory Visit: Payer: Self-pay | Admitting: *Deleted

## 2021-08-21 DIAGNOSIS — D751 Secondary polycythemia: Secondary | ICD-10-CM

## 2021-08-21 NOTE — Progress Notes (Signed)
New patient appt for 09/22/21 and labs two days before. Lab orders entered

## 2021-09-28 DIAGNOSIS — E1129 Type 2 diabetes mellitus with other diabetic kidney complication: Secondary | ICD-10-CM | POA: Insufficient documentation

## 2021-09-28 DIAGNOSIS — E11319 Type 2 diabetes mellitus with unspecified diabetic retinopathy without macular edema: Secondary | ICD-10-CM | POA: Insufficient documentation

## 2022-10-15 ENCOUNTER — Encounter (INDEPENDENT_AMBULATORY_CARE_PROVIDER_SITE_OTHER): Payer: BC Managed Care – PPO | Admitting: Ophthalmology

## 2022-10-15 DIAGNOSIS — I10 Essential (primary) hypertension: Secondary | ICD-10-CM

## 2022-10-15 DIAGNOSIS — E113511 Type 2 diabetes mellitus with proliferative diabetic retinopathy with macular edema, right eye: Secondary | ICD-10-CM | POA: Diagnosis not present

## 2022-10-15 DIAGNOSIS — H43813 Vitreous degeneration, bilateral: Secondary | ICD-10-CM

## 2022-10-15 DIAGNOSIS — E113392 Type 2 diabetes mellitus with moderate nonproliferative diabetic retinopathy without macular edema, left eye: Secondary | ICD-10-CM | POA: Diagnosis not present

## 2022-10-15 DIAGNOSIS — H35033 Hypertensive retinopathy, bilateral: Secondary | ICD-10-CM | POA: Diagnosis not present

## 2022-11-12 ENCOUNTER — Encounter (INDEPENDENT_AMBULATORY_CARE_PROVIDER_SITE_OTHER): Payer: BC Managed Care – PPO | Admitting: Ophthalmology

## 2022-11-12 DIAGNOSIS — E113392 Type 2 diabetes mellitus with moderate nonproliferative diabetic retinopathy without macular edema, left eye: Secondary | ICD-10-CM

## 2022-11-12 DIAGNOSIS — H43813 Vitreous degeneration, bilateral: Secondary | ICD-10-CM

## 2022-11-12 DIAGNOSIS — I10 Essential (primary) hypertension: Secondary | ICD-10-CM

## 2022-11-12 DIAGNOSIS — E113511 Type 2 diabetes mellitus with proliferative diabetic retinopathy with macular edema, right eye: Secondary | ICD-10-CM | POA: Diagnosis not present

## 2022-11-12 DIAGNOSIS — H35033 Hypertensive retinopathy, bilateral: Secondary | ICD-10-CM | POA: Diagnosis not present

## 2022-12-10 ENCOUNTER — Encounter (INDEPENDENT_AMBULATORY_CARE_PROVIDER_SITE_OTHER): Payer: BC Managed Care – PPO | Admitting: Ophthalmology

## 2022-12-10 DIAGNOSIS — H35033 Hypertensive retinopathy, bilateral: Secondary | ICD-10-CM | POA: Diagnosis not present

## 2022-12-10 DIAGNOSIS — E113511 Type 2 diabetes mellitus with proliferative diabetic retinopathy with macular edema, right eye: Secondary | ICD-10-CM

## 2022-12-10 DIAGNOSIS — Z7985 Long-term (current) use of injectable non-insulin antidiabetic drugs: Secondary | ICD-10-CM

## 2022-12-10 DIAGNOSIS — H43813 Vitreous degeneration, bilateral: Secondary | ICD-10-CM

## 2022-12-10 DIAGNOSIS — I10 Essential (primary) hypertension: Secondary | ICD-10-CM

## 2022-12-10 DIAGNOSIS — E113392 Type 2 diabetes mellitus with moderate nonproliferative diabetic retinopathy without macular edema, left eye: Secondary | ICD-10-CM

## 2023-01-07 ENCOUNTER — Encounter (INDEPENDENT_AMBULATORY_CARE_PROVIDER_SITE_OTHER): Payer: BC Managed Care – PPO | Admitting: Ophthalmology

## 2023-01-07 DIAGNOSIS — E113392 Type 2 diabetes mellitus with moderate nonproliferative diabetic retinopathy without macular edema, left eye: Secondary | ICD-10-CM | POA: Diagnosis not present

## 2023-01-07 DIAGNOSIS — E113511 Type 2 diabetes mellitus with proliferative diabetic retinopathy with macular edema, right eye: Secondary | ICD-10-CM

## 2023-01-07 DIAGNOSIS — I10 Essential (primary) hypertension: Secondary | ICD-10-CM | POA: Diagnosis not present

## 2023-01-07 DIAGNOSIS — H43813 Vitreous degeneration, bilateral: Secondary | ICD-10-CM

## 2023-01-07 DIAGNOSIS — H35033 Hypertensive retinopathy, bilateral: Secondary | ICD-10-CM

## 2023-01-07 DIAGNOSIS — Z7985 Long-term (current) use of injectable non-insulin antidiabetic drugs: Secondary | ICD-10-CM

## 2023-02-04 ENCOUNTER — Encounter (INDEPENDENT_AMBULATORY_CARE_PROVIDER_SITE_OTHER): Payer: BC Managed Care – PPO | Admitting: Ophthalmology

## 2023-02-07 ENCOUNTER — Encounter (INDEPENDENT_AMBULATORY_CARE_PROVIDER_SITE_OTHER): Payer: BC Managed Care – PPO | Admitting: Ophthalmology

## 2023-02-07 DIAGNOSIS — E113511 Type 2 diabetes mellitus with proliferative diabetic retinopathy with macular edema, right eye: Secondary | ICD-10-CM | POA: Diagnosis not present

## 2023-02-07 DIAGNOSIS — H35033 Hypertensive retinopathy, bilateral: Secondary | ICD-10-CM | POA: Diagnosis not present

## 2023-02-07 DIAGNOSIS — E113392 Type 2 diabetes mellitus with moderate nonproliferative diabetic retinopathy without macular edema, left eye: Secondary | ICD-10-CM | POA: Diagnosis not present

## 2023-02-07 DIAGNOSIS — I10 Essential (primary) hypertension: Secondary | ICD-10-CM | POA: Diagnosis not present

## 2023-02-07 DIAGNOSIS — H43813 Vitreous degeneration, bilateral: Secondary | ICD-10-CM

## 2023-02-07 DIAGNOSIS — Z7985 Long-term (current) use of injectable non-insulin antidiabetic drugs: Secondary | ICD-10-CM

## 2023-03-07 ENCOUNTER — Encounter (INDEPENDENT_AMBULATORY_CARE_PROVIDER_SITE_OTHER): Payer: BC Managed Care – PPO | Admitting: Ophthalmology

## 2023-03-07 DIAGNOSIS — I10 Essential (primary) hypertension: Secondary | ICD-10-CM

## 2023-03-07 DIAGNOSIS — H35033 Hypertensive retinopathy, bilateral: Secondary | ICD-10-CM

## 2023-03-07 DIAGNOSIS — E113511 Type 2 diabetes mellitus with proliferative diabetic retinopathy with macular edema, right eye: Secondary | ICD-10-CM | POA: Diagnosis not present

## 2023-03-07 DIAGNOSIS — Z7985 Long-term (current) use of injectable non-insulin antidiabetic drugs: Secondary | ICD-10-CM | POA: Diagnosis not present

## 2023-03-07 DIAGNOSIS — E113392 Type 2 diabetes mellitus with moderate nonproliferative diabetic retinopathy without macular edema, left eye: Secondary | ICD-10-CM

## 2023-03-07 DIAGNOSIS — H43813 Vitreous degeneration, bilateral: Secondary | ICD-10-CM

## 2023-04-08 ENCOUNTER — Encounter (INDEPENDENT_AMBULATORY_CARE_PROVIDER_SITE_OTHER): Payer: BC Managed Care – PPO | Admitting: Ophthalmology

## 2023-04-08 DIAGNOSIS — Z7985 Long-term (current) use of injectable non-insulin antidiabetic drugs: Secondary | ICD-10-CM | POA: Diagnosis not present

## 2023-04-08 DIAGNOSIS — I10 Essential (primary) hypertension: Secondary | ICD-10-CM | POA: Diagnosis not present

## 2023-04-08 DIAGNOSIS — H43813 Vitreous degeneration, bilateral: Secondary | ICD-10-CM

## 2023-04-08 DIAGNOSIS — E113511 Type 2 diabetes mellitus with proliferative diabetic retinopathy with macular edema, right eye: Secondary | ICD-10-CM | POA: Diagnosis not present

## 2023-04-08 DIAGNOSIS — E113392 Type 2 diabetes mellitus with moderate nonproliferative diabetic retinopathy without macular edema, left eye: Secondary | ICD-10-CM | POA: Diagnosis not present

## 2023-04-08 DIAGNOSIS — H35033 Hypertensive retinopathy, bilateral: Secondary | ICD-10-CM

## 2023-05-06 ENCOUNTER — Encounter (INDEPENDENT_AMBULATORY_CARE_PROVIDER_SITE_OTHER): Payer: BC Managed Care – PPO | Admitting: Ophthalmology

## 2023-05-06 DIAGNOSIS — Z7985 Long-term (current) use of injectable non-insulin antidiabetic drugs: Secondary | ICD-10-CM

## 2023-05-06 DIAGNOSIS — E113392 Type 2 diabetes mellitus with moderate nonproliferative diabetic retinopathy without macular edema, left eye: Secondary | ICD-10-CM | POA: Diagnosis not present

## 2023-05-06 DIAGNOSIS — I10 Essential (primary) hypertension: Secondary | ICD-10-CM

## 2023-05-06 DIAGNOSIS — E113511 Type 2 diabetes mellitus with proliferative diabetic retinopathy with macular edema, right eye: Secondary | ICD-10-CM | POA: Diagnosis not present

## 2023-05-06 DIAGNOSIS — H43813 Vitreous degeneration, bilateral: Secondary | ICD-10-CM

## 2023-05-06 DIAGNOSIS — H35033 Hypertensive retinopathy, bilateral: Secondary | ICD-10-CM

## 2023-06-03 ENCOUNTER — Encounter (INDEPENDENT_AMBULATORY_CARE_PROVIDER_SITE_OTHER): Payer: BC Managed Care – PPO | Admitting: Ophthalmology

## 2023-06-03 DIAGNOSIS — E113392 Type 2 diabetes mellitus with moderate nonproliferative diabetic retinopathy without macular edema, left eye: Secondary | ICD-10-CM | POA: Diagnosis not present

## 2023-06-03 DIAGNOSIS — H35033 Hypertensive retinopathy, bilateral: Secondary | ICD-10-CM

## 2023-06-03 DIAGNOSIS — I10 Essential (primary) hypertension: Secondary | ICD-10-CM | POA: Diagnosis not present

## 2023-06-03 DIAGNOSIS — E113511 Type 2 diabetes mellitus with proliferative diabetic retinopathy with macular edema, right eye: Secondary | ICD-10-CM | POA: Diagnosis not present

## 2023-06-03 DIAGNOSIS — Z7985 Long-term (current) use of injectable non-insulin antidiabetic drugs: Secondary | ICD-10-CM | POA: Diagnosis not present

## 2023-06-03 DIAGNOSIS — H43813 Vitreous degeneration, bilateral: Secondary | ICD-10-CM

## 2023-07-01 ENCOUNTER — Encounter (INDEPENDENT_AMBULATORY_CARE_PROVIDER_SITE_OTHER): Payer: BC Managed Care – PPO | Admitting: Ophthalmology

## 2023-07-01 DIAGNOSIS — E113511 Type 2 diabetes mellitus with proliferative diabetic retinopathy with macular edema, right eye: Secondary | ICD-10-CM | POA: Diagnosis not present

## 2023-07-01 DIAGNOSIS — I10 Essential (primary) hypertension: Secondary | ICD-10-CM | POA: Diagnosis not present

## 2023-07-01 DIAGNOSIS — Z7985 Long-term (current) use of injectable non-insulin antidiabetic drugs: Secondary | ICD-10-CM | POA: Diagnosis not present

## 2023-07-01 DIAGNOSIS — H35033 Hypertensive retinopathy, bilateral: Secondary | ICD-10-CM

## 2023-07-01 DIAGNOSIS — H43813 Vitreous degeneration, bilateral: Secondary | ICD-10-CM

## 2023-07-01 DIAGNOSIS — E113392 Type 2 diabetes mellitus with moderate nonproliferative diabetic retinopathy without macular edema, left eye: Secondary | ICD-10-CM

## 2023-07-30 ENCOUNTER — Encounter (INDEPENDENT_AMBULATORY_CARE_PROVIDER_SITE_OTHER): Payer: BC Managed Care – PPO | Admitting: Ophthalmology

## 2023-07-30 DIAGNOSIS — Z7985 Long-term (current) use of injectable non-insulin antidiabetic drugs: Secondary | ICD-10-CM

## 2023-07-30 DIAGNOSIS — E113511 Type 2 diabetes mellitus with proliferative diabetic retinopathy with macular edema, right eye: Secondary | ICD-10-CM | POA: Diagnosis not present

## 2023-07-30 DIAGNOSIS — E113292 Type 2 diabetes mellitus with mild nonproliferative diabetic retinopathy without macular edema, left eye: Secondary | ICD-10-CM | POA: Diagnosis not present

## 2023-07-30 DIAGNOSIS — H35033 Hypertensive retinopathy, bilateral: Secondary | ICD-10-CM

## 2023-07-30 DIAGNOSIS — I10 Essential (primary) hypertension: Secondary | ICD-10-CM | POA: Diagnosis not present

## 2023-07-30 DIAGNOSIS — H43813 Vitreous degeneration, bilateral: Secondary | ICD-10-CM

## 2023-08-26 ENCOUNTER — Encounter (INDEPENDENT_AMBULATORY_CARE_PROVIDER_SITE_OTHER): Payer: BC Managed Care – PPO | Admitting: Ophthalmology

## 2023-08-26 DIAGNOSIS — E113392 Type 2 diabetes mellitus with moderate nonproliferative diabetic retinopathy without macular edema, left eye: Secondary | ICD-10-CM

## 2023-08-26 DIAGNOSIS — H35033 Hypertensive retinopathy, bilateral: Secondary | ICD-10-CM

## 2023-08-26 DIAGNOSIS — Z7985 Long-term (current) use of injectable non-insulin antidiabetic drugs: Secondary | ICD-10-CM

## 2023-08-26 DIAGNOSIS — E113511 Type 2 diabetes mellitus with proliferative diabetic retinopathy with macular edema, right eye: Secondary | ICD-10-CM

## 2023-08-26 DIAGNOSIS — H43813 Vitreous degeneration, bilateral: Secondary | ICD-10-CM

## 2023-08-26 DIAGNOSIS — I10 Essential (primary) hypertension: Secondary | ICD-10-CM | POA: Diagnosis not present

## 2023-09-23 ENCOUNTER — Encounter (INDEPENDENT_AMBULATORY_CARE_PROVIDER_SITE_OTHER): Payer: BC Managed Care – PPO | Admitting: Ophthalmology

## 2023-09-23 DIAGNOSIS — H35033 Hypertensive retinopathy, bilateral: Secondary | ICD-10-CM

## 2023-09-23 DIAGNOSIS — I10 Essential (primary) hypertension: Secondary | ICD-10-CM | POA: Diagnosis not present

## 2023-09-23 DIAGNOSIS — E113392 Type 2 diabetes mellitus with moderate nonproliferative diabetic retinopathy without macular edema, left eye: Secondary | ICD-10-CM

## 2023-09-23 DIAGNOSIS — H43813 Vitreous degeneration, bilateral: Secondary | ICD-10-CM

## 2023-09-23 DIAGNOSIS — Z7985 Long-term (current) use of injectable non-insulin antidiabetic drugs: Secondary | ICD-10-CM | POA: Diagnosis not present

## 2023-09-23 DIAGNOSIS — E113591 Type 2 diabetes mellitus with proliferative diabetic retinopathy without macular edema, right eye: Secondary | ICD-10-CM

## 2023-10-21 ENCOUNTER — Encounter (INDEPENDENT_AMBULATORY_CARE_PROVIDER_SITE_OTHER): Admitting: Ophthalmology

## 2023-10-23 ENCOUNTER — Encounter (INDEPENDENT_AMBULATORY_CARE_PROVIDER_SITE_OTHER): Admitting: Ophthalmology

## 2023-10-23 DIAGNOSIS — I10 Essential (primary) hypertension: Secondary | ICD-10-CM | POA: Diagnosis not present

## 2023-10-23 DIAGNOSIS — E113392 Type 2 diabetes mellitus with moderate nonproliferative diabetic retinopathy without macular edema, left eye: Secondary | ICD-10-CM

## 2023-10-23 DIAGNOSIS — Z7985 Long-term (current) use of injectable non-insulin antidiabetic drugs: Secondary | ICD-10-CM

## 2023-10-23 DIAGNOSIS — E113511 Type 2 diabetes mellitus with proliferative diabetic retinopathy with macular edema, right eye: Secondary | ICD-10-CM

## 2023-10-23 DIAGNOSIS — H35033 Hypertensive retinopathy, bilateral: Secondary | ICD-10-CM

## 2023-10-23 DIAGNOSIS — H43813 Vitreous degeneration, bilateral: Secondary | ICD-10-CM

## 2023-11-04 ENCOUNTER — Ambulatory Visit: Admission: EM | Admit: 2023-11-04 | Discharge: 2023-11-04 | Disposition: A | Discharge status: Home / Self Care

## 2023-11-04 ENCOUNTER — Ambulatory Visit

## 2023-11-04 DIAGNOSIS — L03116 Cellulitis of left lower limb: Secondary | ICD-10-CM

## 2023-11-04 DIAGNOSIS — L03115 Cellulitis of right lower limb: Secondary | ICD-10-CM | POA: Diagnosis not present

## 2023-11-04 MED ORDER — DOXYCYCLINE HYCLATE 100 MG PO CAPS: 100.0000 mg | ORAL_CAPSULE | Freq: Two times a day (BID) | ORAL | 0 refills | Status: DC

## 2023-11-04 NOTE — Discharge Instructions (Addendum)
 Advised patient to take medication as directed with food to completion.  Encouraged to increase daily water intake to 64 ounces per day while taking this medication.  Advised patient to follow-up with [facility] podiatry on Thursday morning, 11/07/2023 at 8:30 AM.  Advised please follow-up with podiatry for further evaluation.  Advised patient to allow all blisters/sores to heal by secondary intention/scab formation.  Advised patient to keep open to air is much as possible.

## 2023-11-04 NOTE — ED Triage Notes (Signed)
 Pt states that he has sores on a couple of his toes on both feet. X2 weeks  Pt states that he is a diabetic. Pt states that he has had a low grade fever.

## 2023-11-04 NOTE — ED Provider Notes (Signed)
 [facility]    CSN: [id number] Arrival date & time: 11/04/23  1054      History   Chief Complaint Chief Complaint  Patient presents with   Nail Problem    HPI Kyle [name] is a 63 y.o. male.   HPI 63 year old male presents with sores on toes of both feet.  PMH significant for morbid obesity, T2DM with diabetic neuropathy, and HTN.  Patient is accompanied by his wife this morning.  Patient reports wearing new work boots for the past 2 weeks and is unable to feel his feet due to peripheral neuropathies.  Patient was unaware of sores developing on feet until after wearing boots for 10-14 days.  Past Medical History:  Diagnosis Date   Diabetes mellitus without complication (HCC)    Elevated cholesterol    GERD (gastroesophageal reflux disease)    History of kidney stones 2013   Hypertension    Pancreatitis age 43   with mumps, no problems since   PONV (postoperative nausea and vomiting) yrs ago    Patient Active Problem List   Diagnosis Date Noted   Acute respiratory failure with hypoxia (HCC) 05/18/2019   COVID-19 virus infection 05/18/2019   Type 2 diabetes mellitus, uncontrolled, with neuropathy 05/18/2019   Hypokalemia 05/18/2019   Smokeless tobacco use 05/18/2019   Essential hypertension 05/18/2019    Past Surgical History:  Procedure Laterality Date   ankle surgery for fx Right 24 yrs ago   COLONOSCOPY WITH PROPOFOL N/A 02/20/2016   Procedure: COLONOSCOPY WITH PROPOFOL;  Surgeon: Charolett Bumpers, MD;  Location: [facility];  Service: Endoscopy;  Laterality: N/A;   cyst removed between legs  11/2013   EXTRACORPOREAL SHOCK WAVE LITHOTRIPSY  2013       Home Medications    Prior to Admission medications   Medication Sig Start Date End Date Taking? Authorizing Provider  amLODipine (NORVASC) 10 MG tablet Take 10 mg by mouth daily. 04/25/21  Yes [provider]  benazepril-hydrochlorthiazide (LOTENSIN HCT) 20-25 MG tablet Take 1 tablet  by mouth daily. 02/13/21  Yes [provider]  doxycycline (VIBRAMYCIN) 100 MG capsule Take 1 capsule (100 mg total) by mouth 2 (two) times daily for 10 days. 11/04/23 11/14/23 Yes Trevor Iha, FNP  gabapentin (NEURONTIN) 300 MG capsule 4 tablets twice a day, for 1 week, then decrease to 3 tablets twice a day for 1 week 05/29/21  Yes Mancel Bale, MD  metoprolol tartrate (LOPRESSOR) 50 MG tablet Take 50 mg by mouth daily. 10/03/16  Yes [provider]  MOUNJARO 12.5 MG/0.5ML Pen SMARTSIG:12.5 Milligram(s) SUB-Q Once a Week   Yes [provider]  RA KRILL OIL 500 MG CAPS Take 500 mg by mouth daily.   Yes [provider]  simvastatin (ZOCOR) 40 MG tablet Take 40 mg by mouth daily at 6 PM.  01/24/16  Yes [provider]  SYNJARDY XR 12.11-998 MG TB24 Take 1 tablet by mouth 2 (two) times daily.   Yes [provider]  vitamin B-12 (CYANOCOBALAMIN) 500 MCG tablet Take 500 mcg by mouth daily.   Yes [provider]  brimonidine (ALPHAGAN) 0.2 % ophthalmic solution Place 1 drop into the right eye 3 (three) times daily. 02/08/21   [provider]  clotrimazole (LOTRIMIN) 1 % cream Apply to affected area . Low abdomen, 2 times daily 05/29/21   Mancel Bale, MD  dorzolamide-timolol (COSOPT) 22.3-6.8 MG/ML ophthalmic solution Place 1 drop into the right eye 2 (two) times daily.  [provider]  Insulin Glargine (BASAGLAR KWIKPEN) 100 UNIT/ML SOPN Inject 30 Units into the skin 2 (two) times daily. 04/22/19   [provider]  JARDIANCE 25 MG TABS tablet Take 25 mg by mouth daily. 04/13/21   [provider]  ketorolac (ACULAR) 0.5 % ophthalmic solution Place 1 drop into the right eye daily.    [provider]  latanoprost (XALATAN) 0.005 % ophthalmic solution Place 1 drop into the right eye at bedtime. 05/04/21   [provider]  lidocaine (LIDODERM) 5 % Place 1 patch onto the skin daily. Remove &  Discard patch within 12 hours or as directed by MD Patient not taking: Reported on 05/18/2019 01/13/18   Maczis, Maybelline Kolarik M, PA-C  metFORMIN (GLUCOPHAGE) 1000 MG tablet Take 1,000 mg by mouth daily. 04/13/21   [provider]  methocarbamol (ROBAXIN) 500 MG tablet Take 1 tablet (500 mg total) by mouth 2 (two) times daily. Patient not taking: Reported on 05/18/2019 01/13/18   Maczis, Maxey Ransom M, PA-C  naproxen (NAPROSYN) 500 MG tablet Take 1 tablet (500 mg total) by mouth 2 (two) times daily. Patient not taking: Reported on 05/18/2019 01/13/18   Maczis, Mazi Brailsford M, PA-C  olmesartan-hydrochlorothiazide (BENICAR HCT) 40-25 MG tablet Take 1 tablet by mouth daily.    [provider]  TRULICITY 4.5 MG/0.5ML SOPN Inject 4.5 mg into the skin once a week. 05/22/21   [provider]  Vitamins/Minerals TABS Take 1 tablet by mouth daily. Calcium, magnesium & Zinc    [provider]    Family History Family History  Problem Relation Age of Onset   Pancreatic cancer Mother    Alzheimer's disease Father     Social History Social History   Tobacco Use   Smoking status: Never   Smokeless tobacco: Current    Types: Snuff  Vaping Use   Vaping status: Never Used  Substance Use Topics   Alcohol use: No   Drug use: No     Allergies   Acetazolamide, Hydrocodone, Penicillins, and Oxycodone   Review of Systems Review of Systems  Skin:        Blisters on toes for 2 weeks patient is diabetic  All other systems reviewed and are negative.    Physical Exam Triage Vital Signs ED Triage Vitals  Encounter Vitals Group     BP 11/04/23 1116 (!) 180/91     Systolic BP Percentile --      Diastolic BP Percentile --      Pulse Rate 11/04/23 1116 74     Resp 11/04/23 1116 16     Temp 11/04/23 1116 99 F (37.2 C)     Temp Source 11/04/23 1116 Oral     SpO2 11/04/23 1116 95 %     Weight 11/04/23 1111 255 lb (115.7 kg)     Height 11/04/23 1111 5\' 10"  (1.778 m)     Head  Circumference --      Peak Flow --      Pain Score 11/04/23 1111 0     Pain Loc --      Pain Education --      Exclude from Growth Chart --    No data found.  Updated Vital Signs BP (!) 180/91 (BP Location: Right Arm)   Pulse 74   Temp 99 F (37.2 C) (Oral)   Resp 16   Ht 5\' 10"  (1.778 m)   Wt 255 lb (115.7 kg)   SpO2 95%   BMI 36.59  kg/m    Physical Exam Vitals and nursing note reviewed.  Constitutional:      Appearance: Normal appearance. He is obese.  HENT:     Head: Normocephalic and atraumatic.  Eyes:     Extraocular Movements: Extraocular movements intact.     Conjunctiva/sclera: Conjunctivae normal.     Pupils: Pupils are equal, round, and reactive to light.  Cardiovascular:     Rate and Rhythm: Normal rate and regular rhythm.     Pulses: Normal pulses.     Heart sounds: Normal heart sounds.  Pulmonary:     Effort: Pulmonary effort is normal.     Breath sounds: Normal breath sounds. No wheezing, rhonchi or rales.  Musculoskeletal:        General: Normal range of motion.     Cervical back: Normal range of motion and neck supple.     Comments: Left foot: 2nd and 3rd toes concerning for possible osteomyelitis; Right foot: Third toe concerning for possible osteomyelitis please see image below  Skin:    General: Skin is warm and dry.  Neurological:     General: No focal deficit present.     Mental Status: He is alert and oriented to person, place, and time. Mental status is at baseline.  Psychiatric:        Mood and Affect: Mood normal.        Behavior: Behavior normal.        Thought Content: Thought content normal.      UC Treatments / Results  Labs (all labs ordered are listed, but only abnormal results are displayed) Labs Reviewed - No data to display  EKG   Radiology DG Foot Complete Right Result Date: 11/04/2023 CLINICAL DATA:  Blisters on right third toe. Evaluate for acute osteomyelitis. EXAM: RIGHT FOOT COMPLETE - 3+ VIEW COMPARISON:  None  Available. FINDINGS: Small chronic ossicle at the lateral aspect of the second metatarsophalangeal joint. Mild second through fifth interphalangeal joint space narrowing diffusely. Mild chronic enthesopathic bone formation at the plantar lateral base of the proximal phalanx of the great toe. Moderate plantar and tiny posterior calcaneal heel spurs. Moderate anterior distal tibial find and adjacent talar dome-neck junction degenerative osteophytosis. Moderate navicular-cuneiform and mild-to-moderate dorsal second tarsometatarsal degenerative osteophytosis on lateral view. No acute fracture dislocation. No cortical erosion. No subcutaneous air. A single screw is seen overlying the medial malleolus and two screws are seen overlying the distal fibula, likely remote ORIF. IMPRESSION: 1. No radiographic evidence of acute osteomyelitis. 2. 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 3. Moderate plantar and tiny posterior calcaneal heel spurs. Electronically Signed   By: Neita Garnet M.D.   On: 11/04/2023 12:42   DG Foot Complete Left Result Date: 11/04/2023 CLINICAL DATA:  Left foot pain. Blisters on left second and third toes. EXAM: LEFT FOOT - COMPLETE 3+ VIEW COMPARISON:  None Available. FINDINGS: Mild hallux valgus. Old healed fracture of the midshaft of the first metatarsal with moderate chronic peripheral bridging callus formation. Mild-to-moderate second and third tarsometatarsal joint space narrowing, subchondral sclerosis, peripheral osteophytosis. Mild-to-moderate plantar and mild posterior calcaneal heel spurs. Mild-to-moderate distal anterior tibial plafond degenerative osteophytosis. Moderate dorsal navicular-cuneiform and mild second tarsometatarsal degenerative osteophytes. Mild degenerative spurring with a small chronic ossicle at the lateral base of the proximal phalanx of the second toe. Possible mild plantar/medial subluxation of the middle phalanx with respect of the proximal phalanx of  the third toe. No cortical erosion is seen.  No acute fracture or dislocation. IMPRESSION: 1.  Mild hallux valgus. 2. Old healed fracture of the midshaft of the first metatarsal. 3. Mild-to-moderate second and third tarsometatarsal osteoarthritis. 4. Mild-to-moderate plantar and mild posterior calcaneal heel spurs. Electronically Signed   By: Bertina Broccoli M.D.   On: 11/04/2023 12:40    Procedures Procedures (including critical care time)  Medications Ordered in UC Medications - No data to display  Initial Impression / Assessment and Plan / UC Course  I have reviewed the triage vital signs and the nursing notes.  Pertinent labs & imaging results that were available during my care of the patient were reviewed by me and considered in my medical decision making (see chart for details).     MDM: 1.  Cellulitis of left foot-x-ray results of left foot reveals above, Rx'd doxycycline 100 mg capsule: Take 1 capsule twice daily x 10; 2.  Cellulitis of right foot-x-ray results of right foot reveals above.  Patient is scheduled in our clinic today for [facility] on 11/07/2023 at 8:30 AM at [facility]. Advised patient to take medication as directed with food to completion.  Encouraged to increase daily water intake to 64 ounces per day while taking this medication.  Advised patient to follow-up with [facility] podiatry on Thursday morning, 11/07/2023 at 8:30 AM.  Advised please follow-up with podiatry for further evaluation. Advised patient to allow all blisters/sores to heal by secondary intention/scab formation.  Advised patient to keep open to air is much as possible. Patient discharged home, hemodynamically stable.  Work note provided to patient prior to discharge today. Final Clinical Impressions(s) / UC Diagnoses   Final diagnoses:  Cellulitis of left foot  Cellulitis of right foot     Discharge Instructions      Advised patient to take medication as directed with food  to completion.  Encouraged to increase daily water intake to 64 ounces per day while taking this medication.  Advised patient to follow-up with [facility] podiatry on Thursday morning, 11/07/2023 at 8:30 AM.  Advised please follow-up with podiatry for further evaluation.  Advised patient to allow all blisters/sores to heal by secondary intention/scab formation.  Advised patient to keep open to air is much as possible.     ED Prescriptions     Medication Sig Dispense Auth. Provider   doxycycline (VIBRAMYCIN) 100 MG capsule Take 1 capsule (100 mg total) by mouth 2 (two) times daily for 10 days. 20 capsule Raza Bayless, FNP      PDMP not reviewed this encounter.   Leonides Ramp, FNP 11/04/23 1315

## 2023-11-07 ENCOUNTER — Other Ambulatory Visit: Payer: Self-pay

## 2023-11-07 ENCOUNTER — Encounter (HOSPITAL_COMMUNITY): Payer: Self-pay | Admitting: *Deleted

## 2023-11-07 ENCOUNTER — Emergency Department (HOSPITAL_COMMUNITY)

## 2023-11-07 ENCOUNTER — Encounter: Payer: Self-pay | Admitting: Podiatry

## 2023-11-07 ENCOUNTER — Inpatient Hospital Stay (HOSPITAL_COMMUNITY)
Admission: EM | Admit: 2023-11-07 | Discharge: 2023-11-09 | DRG: 617 | Disposition: A | Discharge status: Home / Self Care | Attending: Internal Medicine | Admitting: Internal Medicine

## 2023-11-07 ENCOUNTER — Ambulatory Visit: Admitting: Podiatry

## 2023-11-07 DIAGNOSIS — Z880 Allergy status to penicillin: Secondary | ICD-10-CM

## 2023-11-07 DIAGNOSIS — E11621 Type 2 diabetes mellitus with foot ulcer: Secondary | ICD-10-CM | POA: Diagnosis present

## 2023-11-07 DIAGNOSIS — E1149 Type 2 diabetes mellitus with other diabetic neurological complication: Secondary | ICD-10-CM | POA: Diagnosis present

## 2023-11-07 DIAGNOSIS — Z800 Family history of malignant neoplasm of digestive organs: Secondary | ICD-10-CM

## 2023-11-07 DIAGNOSIS — Z820 Family history of epilepsy and other diseases of the nervous system: Secondary | ICD-10-CM

## 2023-11-07 DIAGNOSIS — E7800 Pure hypercholesterolemia, unspecified: Secondary | ICD-10-CM | POA: Diagnosis present

## 2023-11-07 DIAGNOSIS — E1140 Type 2 diabetes mellitus with diabetic neuropathy, unspecified: Secondary | ICD-10-CM

## 2023-11-07 DIAGNOSIS — L97524 Non-pressure chronic ulcer of other part of left foot with necrosis of bone: Secondary | ICD-10-CM

## 2023-11-07 DIAGNOSIS — E08621 Diabetes mellitus due to underlying condition with foot ulcer: Secondary | ICD-10-CM | POA: Diagnosis not present

## 2023-11-07 DIAGNOSIS — Z79899 Other long term (current) drug therapy: Secondary | ICD-10-CM

## 2023-11-07 DIAGNOSIS — K219 Gastro-esophageal reflux disease without esophagitis: Secondary | ICD-10-CM | POA: Diagnosis present

## 2023-11-07 DIAGNOSIS — M869 Osteomyelitis, unspecified: Secondary | ICD-10-CM | POA: Diagnosis not present

## 2023-11-07 DIAGNOSIS — E1169 Type 2 diabetes mellitus with other specified complication: Secondary | ICD-10-CM | POA: Diagnosis not present

## 2023-11-07 DIAGNOSIS — E66812 Obesity, class 2: Secondary | ICD-10-CM | POA: Diagnosis present

## 2023-11-07 DIAGNOSIS — Z888 Allergy status to other drugs, medicaments and biological substances status: Secondary | ICD-10-CM

## 2023-11-07 DIAGNOSIS — M86172 Other acute osteomyelitis, left ankle and foot: Secondary | ICD-10-CM

## 2023-11-07 DIAGNOSIS — L97529 Non-pressure chronic ulcer of other part of left foot with unspecified severity: Secondary | ICD-10-CM | POA: Diagnosis present

## 2023-11-07 DIAGNOSIS — F17290 Nicotine dependence, other tobacco product, uncomplicated: Secondary | ICD-10-CM | POA: Diagnosis present

## 2023-11-07 DIAGNOSIS — Z6839 Body mass index (BMI) 39.0-39.9, adult: Secondary | ICD-10-CM

## 2023-11-07 DIAGNOSIS — L97514 Non-pressure chronic ulcer of other part of right foot with necrosis of bone: Secondary | ICD-10-CM

## 2023-11-07 DIAGNOSIS — E1165 Type 2 diabetes mellitus with hyperglycemia: Secondary | ICD-10-CM | POA: Diagnosis present

## 2023-11-07 DIAGNOSIS — M86171 Other acute osteomyelitis, right ankle and foot: Secondary | ICD-10-CM | POA: Diagnosis present

## 2023-11-07 DIAGNOSIS — I10 Essential (primary) hypertension: Secondary | ICD-10-CM | POA: Diagnosis present

## 2023-11-07 DIAGNOSIS — L03032 Cellulitis of left toe: Secondary | ICD-10-CM | POA: Diagnosis present

## 2023-11-07 DIAGNOSIS — E1152 Type 2 diabetes mellitus with diabetic peripheral angiopathy with gangrene: Secondary | ICD-10-CM | POA: Diagnosis present

## 2023-11-07 DIAGNOSIS — Z87442 Personal history of urinary calculi: Secondary | ICD-10-CM

## 2023-11-07 DIAGNOSIS — Z885 Allergy status to narcotic agent status: Secondary | ICD-10-CM

## 2023-11-07 DIAGNOSIS — Z7901 Long term (current) use of anticoagulants: Secondary | ICD-10-CM

## 2023-11-07 DIAGNOSIS — L03031 Cellulitis of right toe: Secondary | ICD-10-CM | POA: Diagnosis present

## 2023-11-07 DIAGNOSIS — Z794 Long term (current) use of insulin: Secondary | ICD-10-CM

## 2023-11-07 LAB — CBC WITH DIFFERENTIAL/PLATELET
Abs Immature Granulocytes: 0.02 10*3/uL (ref 0.00–0.07)
Basophils Absolute: 0 10*3/uL (ref 0.0–0.1)
Basophils Relative: 0 %
Eosinophils Absolute: 0.2 10*3/uL (ref 0.0–0.5)
Eosinophils Relative: 2 %
HCT: 47.7 % (ref 39.0–52.0)
Hemoglobin: 15.9 g/dL (ref 13.0–17.0)
Immature Granulocytes: 0 %
Lymphocytes Relative: 16 %
Lymphs Abs: 1.3 10*3/uL (ref 0.7–4.0)
MCH: 31.2 pg (ref 26.0–34.0)
MCHC: 33.3 g/dL (ref 30.0–36.0)
MCV: 93.5 fL (ref 80.0–100.0)
Monocytes Absolute: 0.9 10*3/uL (ref 0.1–1.0)
Monocytes Relative: 11 %
Neutro Abs: 5.8 10*3/uL (ref 1.7–7.7)
Neutrophils Relative %: 71 %
Platelets: 293 10*3/uL (ref 150–400)
RBC: 5.1 MIL/uL (ref 4.22–5.81)
RDW: 12.8 % (ref 11.5–15.5)
WBC: 8.2 10*3/uL (ref 4.0–10.5)
nRBC: 0 % (ref 0.0–0.2)

## 2023-11-07 LAB — BASIC METABOLIC PANEL WITH GFR
Anion gap: 11 (ref 5–15)
BUN: 14 mg/dL (ref 6–20)
CO2: 22 mmol/L (ref 22–32)
Calcium: 9.5 mg/dL (ref 8.9–10.3)
Chloride: 108 mmol/L (ref 98–111)
Creatinine, Ser: 0.84 mg/dL (ref 0.61–1.24)
GFR, Estimated: >60 mL/min (ref >60)
Glucose, Bld: 118 mg/dL — ABNORMAL HIGH (ref 70–99)
Potassium: 4.5 mmol/L (ref 3.5–5.1)
Sodium: 141 mmol/L (ref 135–145)

## 2023-11-07 MED ORDER — LORAZEPAM 1 MG PO TABS
1.0000 mg | ORAL_TABLET | Freq: Once | ORAL | Status: AC
  Administered 2023-11-07: 1 mg via ORAL
  Filled 2023-11-07: qty 1

## 2023-11-07 NOTE — Plan of Care (Signed)
 Aware of patient, sent from Dr. Lennie Ra office with concern for osteomyelitis bilateral toes. Will see tomorrow pre operatively. Plan for amputation of Right 3rd toe and left 2nd and 3rd toes. Please keep NPO past MN, hold anticoagulation pre op. Ok for broad spectrum IV abx. Thank you        Maridee Shoemaker, DPM [facility]                   11/07/2023

## 2023-11-07 NOTE — ED Notes (Signed)
 Spoke with MRI, pt. Has never been in a MRI before.  Pt.  Pre medicated before procedure

## 2023-11-07 NOTE — Progress Notes (Signed)
  Subjective:  Patient ID: Kyle [name], male    DOB: 1961-05-14,   MRN: [id number]  Chief Complaint  Patient presents with   Foot Ulcer    Pt presents for sores  on both feet, states it started as blisters that popped.Patient stated that  wearing new work boots when it all happed unable to feel his feet due neuropathy Patient was unaware of sores developing on feet until after he foot home from work.    63 y.o. male presents for concern of bilateral toe wounds. Relates they appears two and a half weeks ago after wearing a pair of boots. Has been trying to keep them covered. Relates last weekend did develop a fever and was seen in urgent care. He was given antibtioics and had follow-up with us . Relates since then no change in the toes. Denies fever.  Most recent A1c was 5.9. Denies any other pedal complaints. Denies n/v/f/c.   Past Medical History:  Diagnosis Date   Diabetes mellitus without complication (HCC)    Elevated cholesterol    GERD (gastroesophageal reflux disease)    History of kidney stones 2013   Hypertension    Pancreatitis age 39   with mumps, no problems since   PONV (postoperative nausea and vomiting) yrs ago    Objective:  Physical Exam: Vascular: DP/PT pulses 2/4 bilateral. CFT <3 seconds. Absent hair growth on digits. Edema noted to bilateral lower extremities. Xerosis noted bilaterally.  Skin. No lacerations or abrasions bilateral feet. Nails 1-5 bilateral  are thickened discolored and elongated with subungual debris. Ucleration noted dorsal left second and third digit and right third digit. Probe to bone on all ulcerations. Mild purulence noted to right third digit. Erythema and edema surroudning digits. Concern for deep infection Musculoskeletal: MMT 5/5 bilateral lower extremities in DF, PF, Inversion and Eversion. Deceased ROM in DF of ankle joint.  Neurological: Sensation intact to light touch. Protective sensation diminished bilateral.          Assessment:   1. 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 Diabetic ulcer of toe of right foot associated with diabetes mellitus due to underlying condition, with necrosis of bone (HCC)      Plan:  Patient was evaluated and treated and all questions answered. Ulcer noted right third digit and left second and third digit with likely necrosis of bone  X-rays reviewed from 4/14 and no obvious osseous erosions.  -Discussed findings and probe to bone and no imrpovement on oral antibiotics. Suggest likely deep bone infection and concerns with patient. Discussed his best option is to go to emergency room for IV antibtioics, MRI, vascular studies and likely amputations of the toes. Patient expressed understanding and will head that way.  Dispenesed surgical shoe for offloadin bilateral  -Dressed with betadine, DSD. -Discussed glucose control and proper protein-rich diet.  -Notified the on call doc.     No follow-ups on file.    Jennefer Moats, DPM

## 2023-11-07 NOTE — ED Provider Triage Note (Signed)
 Emergency Medicine Provider Triage Evaluation Note  Kyle [name] , a 63 y.o. male  was evaluated in triage.  Pt complains of osteo work up.  Review of Systems  Positive: ulcer Negative: fever  Physical Exam  BP (!) 174/97   Pulse 71   Temp 98.5 F (36.9 C)   Resp 16   SpO2 100%  Gen:   Awake, no distress   Resp:  Normal effort  MSK:   Moves extremities without difficulty  Other:    Medical Decision Making  Medically screening exam initiated at 2:31 PM.  Appropriate orders placed.  Katie Parks was informed that the remainder of the evaluation will be completed by another provider, this initial triage assessment does not replace that evaluation, and the importance of remaining in the ED until their evaluation is complete.        Eudora Heron, PA-C 11/07/23 1432

## 2023-11-07 NOTE — ED Triage Notes (Signed)
 Pt is here for toe infection (third on right) and second and third on left.  Pt has been seen by podiatry and is on an antibiotic without improvement.  No fever or chills.

## 2023-11-07 NOTE — ED Notes (Signed)
 Patient transported to MRI

## 2023-11-08 ENCOUNTER — Inpatient Hospital Stay (HOSPITAL_COMMUNITY)

## 2023-11-08 ENCOUNTER — Encounter (HOSPITAL_COMMUNITY)
Admission: EM | Disposition: A | Discharge status: Home / Self Care | Payer: Self-pay | Source: Home / Self Care | Attending: Internal Medicine

## 2023-11-08 ENCOUNTER — Inpatient Hospital Stay (HOSPITAL_COMMUNITY): Admitting: Anesthesiology

## 2023-11-08 ENCOUNTER — Other Ambulatory Visit: Payer: Self-pay

## 2023-11-08 DIAGNOSIS — Z79899 Other long term (current) drug therapy: Secondary | ICD-10-CM | POA: Diagnosis not present

## 2023-11-08 DIAGNOSIS — L03032 Cellulitis of left toe: Secondary | ICD-10-CM | POA: Diagnosis present

## 2023-11-08 DIAGNOSIS — Z6839 Body mass index (BMI) 39.0-39.9, adult: Secondary | ICD-10-CM | POA: Diagnosis not present

## 2023-11-08 DIAGNOSIS — E11621 Type 2 diabetes mellitus with foot ulcer: Secondary | ICD-10-CM | POA: Diagnosis present

## 2023-11-08 DIAGNOSIS — E7800 Pure hypercholesterolemia, unspecified: Secondary | ICD-10-CM | POA: Diagnosis present

## 2023-11-08 DIAGNOSIS — Z880 Allergy status to penicillin: Secondary | ICD-10-CM | POA: Diagnosis not present

## 2023-11-08 DIAGNOSIS — E1169 Type 2 diabetes mellitus with other specified complication: Secondary | ICD-10-CM | POA: Diagnosis present

## 2023-11-08 DIAGNOSIS — I10 Essential (primary) hypertension: Secondary | ICD-10-CM | POA: Diagnosis present

## 2023-11-08 DIAGNOSIS — Z800 Family history of malignant neoplasm of digestive organs: Secondary | ICD-10-CM | POA: Diagnosis not present

## 2023-11-08 DIAGNOSIS — K219 Gastro-esophageal reflux disease without esophagitis: Secondary | ICD-10-CM | POA: Diagnosis present

## 2023-11-08 DIAGNOSIS — Z87442 Personal history of urinary calculi: Secondary | ICD-10-CM | POA: Diagnosis not present

## 2023-11-08 DIAGNOSIS — M86172 Other acute osteomyelitis, left ankle and foot: Secondary | ICD-10-CM

## 2023-11-08 DIAGNOSIS — Z888 Allergy status to other drugs, medicaments and biological substances status: Secondary | ICD-10-CM | POA: Diagnosis not present

## 2023-11-08 DIAGNOSIS — M869 Osteomyelitis, unspecified: Secondary | ICD-10-CM | POA: Diagnosis present

## 2023-11-08 DIAGNOSIS — L03031 Cellulitis of right toe: Secondary | ICD-10-CM | POA: Diagnosis present

## 2023-11-08 DIAGNOSIS — M86171 Other acute osteomyelitis, right ankle and foot: Secondary | ICD-10-CM | POA: Diagnosis present

## 2023-11-08 DIAGNOSIS — L97529 Non-pressure chronic ulcer of other part of left foot with unspecified severity: Secondary | ICD-10-CM | POA: Diagnosis present

## 2023-11-08 DIAGNOSIS — Z794 Long term (current) use of insulin: Secondary | ICD-10-CM | POA: Diagnosis not present

## 2023-11-08 DIAGNOSIS — Z885 Allergy status to narcotic agent status: Secondary | ICD-10-CM | POA: Diagnosis not present

## 2023-11-08 DIAGNOSIS — E1152 Type 2 diabetes mellitus with diabetic peripheral angiopathy with gangrene: Secondary | ICD-10-CM | POA: Diagnosis present

## 2023-11-08 DIAGNOSIS — F17290 Nicotine dependence, other tobacco product, uncomplicated: Secondary | ICD-10-CM | POA: Diagnosis present

## 2023-11-08 DIAGNOSIS — Z7901 Long term (current) use of anticoagulants: Secondary | ICD-10-CM | POA: Diagnosis not present

## 2023-11-08 DIAGNOSIS — E1165 Type 2 diabetes mellitus with hyperglycemia: Secondary | ICD-10-CM | POA: Diagnosis present

## 2023-11-08 DIAGNOSIS — E66812 Obesity, class 2: Secondary | ICD-10-CM | POA: Diagnosis present

## 2023-11-08 DIAGNOSIS — Z820 Family history of epilepsy and other diseases of the nervous system: Secondary | ICD-10-CM | POA: Diagnosis not present

## 2023-11-08 HISTORY — PX: AMPUTATION TOE: SHX6595

## 2023-11-08 LAB — SURGICAL PCR SCREEN
MRSA, PCR: NEGATIVE
Staphylococcus aureus: NEGATIVE

## 2023-11-08 LAB — TYPE AND SCREEN
ABO/RH(D): O POS
Antibody Screen: NEGATIVE

## 2023-11-08 LAB — GLUCOSE, CAPILLARY
Glucose-Capillary: 96 mg/dL (ref 70–99)
Glucose-Capillary: 80 mg/dL (ref 70–99)
Glucose-Capillary: 76 mg/dL (ref 70–99)
Glucose-Capillary: 209 mg/dL — ABNORMAL HIGH (ref 70–99)
Glucose-Capillary: 208 mg/dL — ABNORMAL HIGH (ref 70–99)

## 2023-11-08 LAB — C-REACTIVE PROTEIN: CRP: 2.5 mg/dL — ABNORMAL HIGH

## 2023-11-08 LAB — CBC WITH DIFFERENTIAL/PLATELET
Abs Immature Granulocytes: 0.02 10*3/uL (ref 0.00–0.07)
Basophils Absolute: 0 10*3/uL (ref 0.0–0.1)
Basophils Relative: 1 %
Eosinophils Absolute: 0.2 10*3/uL (ref 0.0–0.5)
Eosinophils Relative: 4 %
HCT: 50.6 % (ref 39.0–52.0)
Hemoglobin: 17 g/dL (ref 13.0–17.0)
Immature Granulocytes: 0 %
Lymphocytes Relative: 25 %
Lymphs Abs: 1.5 10*3/uL (ref 0.7–4.0)
MCH: 32.1 pg (ref 26.0–34.0)
MCHC: 33.6 g/dL (ref 30.0–36.0)
MCV: 95.5 fL (ref 80.0–100.0)
Monocytes Absolute: 0.7 10*3/uL (ref 0.1–1.0)
Monocytes Relative: 12 %
Neutro Abs: 3.6 10*3/uL (ref 1.7–7.7)
Neutrophils Relative %: 58 %
Platelets: 270 10*3/uL (ref 150–400)
RBC: 5.3 MIL/uL (ref 4.22–5.81)
RDW: 12.9 % (ref 11.5–15.5)
WBC: 6.1 10*3/uL (ref 4.0–10.5)
nRBC: 0 % (ref 0.0–0.2)

## 2023-11-08 LAB — COMPREHENSIVE METABOLIC PANEL WITH GFR
ALT: 18 U/L (ref 0–44)
AST: 21 U/L (ref 15–41)
Albumin: 3.1 g/dL — ABNORMAL LOW (ref 3.5–5.0)
Alkaline Phosphatase: 64 U/L (ref 38–126)
Anion gap: 12 (ref 5–15)
BUN: 16 mg/dL (ref 6–20)
CO2: 24 mmol/L (ref 22–32)
Calcium: 9.7 mg/dL (ref 8.9–10.3)
Chloride: 105 mmol/L (ref 98–111)
Creatinine, Ser: 0.88 mg/dL (ref 0.61–1.24)
GFR, Estimated: >60 mL/min (ref >60)
Glucose, Bld: 103 mg/dL — ABNORMAL HIGH (ref 70–99)
Potassium: 3.8 mmol/L (ref 3.5–5.1)
Sodium: 141 mmol/L (ref 135–145)
Total Bilirubin: 0.6 mg/dL (ref 0.0–1.2)
Total Protein: 7.1 g/dL (ref 6.5–8.1)

## 2023-11-08 LAB — SEDIMENTATION RATE: Sed Rate: 22 mm/h — ABNORMAL HIGH (ref 0–16)

## 2023-11-08 LAB — HEMOGLOBIN A1C
Hgb A1c MFr Bld: 5.4 % (ref 4.8–5.6)
Mean Plasma Glucose: 108.28 mg/dL

## 2023-11-08 LAB — CBG MONITORING, ED
Glucose-Capillary: 58 mg/dL — ABNORMAL LOW (ref 70–99)
Glucose-Capillary: 122 mg/dL — ABNORMAL HIGH (ref 70–99)

## 2023-11-08 LAB — PROTIME-INR
INR: 1.1 (ref 0.8–1.2)
Prothrombin Time: 14.8 s (ref 11.4–15.2)

## 2023-11-08 LAB — MAGNESIUM: Magnesium: 1.9 mg/dL (ref 1.7–2.4)

## 2023-11-08 SURGERY — AMPUTATION, TOE
Anesthesia: Monitor Anesthesia Care | Site: Toe | Laterality: Bilateral

## 2023-11-08 MED ORDER — DEXAMETHASONE SODIUM PHOSPHATE 10 MG/ML IJ SOLN
INTRAMUSCULAR | Status: DC | PRN
  Administered 2023-11-08: 10 mg via INTRAVENOUS

## 2023-11-08 MED ORDER — ACETAMINOPHEN 500 MG PO TABS
1000.0000 mg | ORAL_TABLET | Freq: Once | ORAL | Status: AC
  Administered 2023-11-08: 1000 mg via ORAL
  Filled 2023-11-08: qty 2

## 2023-11-08 MED ORDER — VANCOMYCIN HCL IN DEXTROSE 1-5 GM/200ML-% IV SOLN: 1000.0000 mg | Freq: Once | INTRAVENOUS | Status: DC

## 2023-11-08 MED ORDER — GABAPENTIN 300 MG PO CAPS
600.0000 mg | ORAL_CAPSULE | Freq: Two times a day (BID) | ORAL | Status: DC
  Administered 2023-11-08 – 2023-11-09 (×2): 600 mg via ORAL
  Filled 2023-11-08 (×2): qty 2

## 2023-11-08 MED ORDER — DORZOLAMIDE HCL-TIMOLOL MAL 2-0.5 % OP SOLN
1.0000 [drp] | Freq: Two times a day (BID) | OPHTHALMIC | Status: DC
  Administered 2023-11-09 (×2): 1 [drp] via OPHTHALMIC
  Filled 2023-11-08 (×2): qty 10

## 2023-11-08 MED ORDER — METRONIDAZOLE 500 MG/100ML IV SOLN
500.0000 mg | Freq: Two times a day (BID) | INTRAVENOUS | Status: DC
  Administered 2023-11-09 (×2): 500 mg via INTRAVENOUS
  Filled 2023-11-08 (×2): qty 100

## 2023-11-08 MED ORDER — SUCCINYLCHOLINE CHLORIDE 200 MG/10ML IV SOSY
PREFILLED_SYRINGE | INTRAVENOUS | Status: DC | PRN
  Administered 2023-11-08: 120 mg via INTRAVENOUS

## 2023-11-08 MED ORDER — DEXAMETHASONE SODIUM PHOSPHATE 10 MG/ML IJ SOLN
INTRAMUSCULAR | Status: AC
Start: 2023-11-08 — End: ?
  Filled 2023-11-08: qty 1

## 2023-11-08 MED ORDER — CHLORHEXIDINE GLUCONATE 0.12 % MT SOLN
OROMUCOSAL | Status: AC
  Administered 2023-11-08: 15 mL via OROMUCOSAL
  Filled 2023-11-08: qty 15

## 2023-11-08 MED ORDER — ORAL CARE MOUTH RINSE: 15.0000 mL | Freq: Once | OROMUCOSAL | Status: AC

## 2023-11-08 MED ORDER — SODIUM CHLORIDE 0.9 % IR SOLN
Status: DC | PRN
  Administered 2023-11-08: 1000 mL

## 2023-11-08 MED ORDER — DEXTROSE 50 % IV SOLN
1.0000 | INTRAVENOUS | Status: DC | PRN
  Administered 2023-11-08: 50 mL via INTRAVENOUS
  Filled 2023-11-08: qty 50

## 2023-11-08 MED ORDER — BUPIVACAINE HCL (PF) 0.5 % IJ SOLN
INTRAMUSCULAR | Status: AC
  Filled 2023-11-08: qty 30

## 2023-11-08 MED ORDER — MIDAZOLAM HCL 2 MG/2ML IJ SOLN
INTRAMUSCULAR | Status: AC
  Filled 2023-11-08: qty 2

## 2023-11-08 MED ORDER — SIMVASTATIN 20 MG PO TABS
40.0000 mg | ORAL_TABLET | Freq: Every day | ORAL | Status: DC
Start: 2023-11-08 — End: 2023-11-09
  Administered 2023-11-08: 40 mg via ORAL
  Filled 2023-11-08: qty 2

## 2023-11-08 MED ORDER — FENTANYL CITRATE (PF) 250 MCG/5ML IJ SOLN
INTRAMUSCULAR | Status: DC | PRN
  Administered 2023-11-08 (×2): 50 ug via INTRAVENOUS

## 2023-11-08 MED ORDER — ACETAMINOPHEN 650 MG RE SUPP: 650.0000 mg | Freq: Four times a day (QID) | RECTAL | Status: DC | PRN

## 2023-11-08 MED ORDER — ACETAMINOPHEN 325 MG PO TABS: 650.0000 mg | ORAL_TABLET | Freq: Four times a day (QID) | ORAL | Status: DC | PRN

## 2023-11-08 MED ORDER — MAGNESIUM OXIDE -MG SUPPLEMENT 400 (240 MG) MG PO TABS
400.0000 mg | ORAL_TABLET | Freq: Every day | ORAL | Status: DC
  Administered 2023-11-09: 400 mg via ORAL
  Filled 2023-11-08: qty 1

## 2023-11-08 MED ORDER — LIDOCAINE 2% (20 MG/ML) 5 ML SYRINGE
INTRAMUSCULAR | Status: DC | PRN
  Administered 2023-11-08: 100 mg via INTRAVENOUS

## 2023-11-08 MED ORDER — ONDANSETRON HCL 4 MG/2ML IJ SOLN
INTRAMUSCULAR | Status: AC
  Filled 2023-11-08: qty 2

## 2023-11-08 MED ORDER — ONDANSETRON HCL 4 MG/2ML IJ SOLN
INTRAMUSCULAR | Status: DC | PRN
  Administered 2023-11-08: 4 mg via INTRAVENOUS

## 2023-11-08 MED ORDER — VANCOMYCIN HCL 10 G IV SOLR
1250.0000 mg | Freq: Two times a day (BID) | INTRAVENOUS | Status: DC
  Filled 2023-11-08 (×2): qty 12.5

## 2023-11-08 MED ORDER — BUPIVACAINE HCL (PF) 0.5 % IJ SOLN
INTRAMUSCULAR | Status: DC | PRN
  Administered 2023-11-08: 18 mL

## 2023-11-08 MED ORDER — BRIMONIDINE TARTRATE 0.2 % OP SOLN
1.0000 [drp] | Freq: Two times a day (BID) | OPHTHALMIC | Status: DC
  Administered 2023-11-08 – 2023-11-09 (×2): 1 [drp] via OPHTHALMIC
  Filled 2023-11-08: qty 5

## 2023-11-08 MED ORDER — PROPOFOL 10 MG/ML IV BOLUS
INTRAVENOUS | Status: DC | PRN
  Administered 2023-11-08: 200 mg via INTRAVENOUS

## 2023-11-08 MED ORDER — ONDANSETRON HCL 4 MG/2ML IJ SOLN: 4.0000 mg | Freq: Four times a day (QID) | INTRAMUSCULAR | Status: DC | PRN

## 2023-11-08 MED ORDER — VANCOMYCIN HCL 1.25 G IV SOLR
1250.0000 mg | Freq: Once | INTRAVENOUS | Status: AC
  Administered 2023-11-08: 1250 mg via INTRAVENOUS
  Filled 2023-11-08: qty 25

## 2023-11-08 MED ORDER — PHENYLEPHRINE 80 MCG/ML (10ML) SYRINGE FOR IV PUSH (FOR BLOOD PRESSURE SUPPORT)
PREFILLED_SYRINGE | INTRAVENOUS | Status: AC
  Filled 2023-11-08: qty 10

## 2023-11-08 MED ORDER — VITAMIN D 25 MCG (1000 UNIT) PO TABS
1000.0000 [IU] | ORAL_TABLET | Freq: Every day | ORAL | Status: DC
Start: 2023-11-08 — End: 2023-11-09
  Administered 2023-11-09: 1000 [IU] via ORAL
  Filled 2023-11-08: qty 1

## 2023-11-08 MED ORDER — FENTANYL CITRATE PF 50 MCG/ML IJ SOSY: 50.0000 ug | PREFILLED_SYRINGE | INTRAMUSCULAR | Status: DC | PRN

## 2023-11-08 MED ORDER — VITAMIN B-12 100 MCG PO TABS
500.0000 ug | ORAL_TABLET | Freq: Every day | ORAL | Status: DC
  Administered 2023-11-09: 500 ug via ORAL
  Filled 2023-11-08: qty 5

## 2023-11-08 MED ORDER — LACTATED RINGERS IV SOLN: INTRAVENOUS | Status: DC

## 2023-11-08 MED ORDER — 0.9 % SODIUM CHLORIDE (POUR BTL) OPTIME
TOPICAL | Status: DC | PRN
  Administered 2023-11-08: 1000 mL

## 2023-11-08 MED ORDER — ROCURONIUM BROMIDE 10 MG/ML (PF) SYRINGE
PREFILLED_SYRINGE | INTRAVENOUS | Status: AC
  Filled 2023-11-08: qty 10

## 2023-11-08 MED ORDER — NALOXONE HCL 0.4 MG/ML IJ SOLN: 0.4000 mg | INTRAMUSCULAR | Status: DC | PRN

## 2023-11-08 MED ORDER — VANCOMYCIN HCL 2000 MG/400ML IV SOLN
2000.0000 mg | Freq: Once | INTRAVENOUS | Status: AC
  Administered 2023-11-08: 2000 mg via INTRAVENOUS
  Filled 2023-11-08: qty 400

## 2023-11-08 MED ORDER — MIDAZOLAM HCL 2 MG/2ML IJ SOLN
INTRAMUSCULAR | Status: DC | PRN
  Administered 2023-11-08: 2 mg via INTRAVENOUS

## 2023-11-08 MED ORDER — SODIUM CHLORIDE 0.9 % IV SOLN
2.0000 g | Freq: Three times a day (TID) | INTRAVENOUS | Status: DC
  Administered 2023-11-08 – 2023-11-09 (×3): 2 g via INTRAVENOUS
  Filled 2023-11-08 (×3): qty 12.5

## 2023-11-08 MED ORDER — SODIUM CHLORIDE 0.9 % IV SOLN
2.0000 g | Freq: Once | INTRAVENOUS | Status: AC
  Administered 2023-11-08: 2 g via INTRAVENOUS
  Filled 2023-11-08: qty 20

## 2023-11-08 MED ORDER — INSULIN ASPART 100 UNIT/ML IJ SOLN
0.0000 [IU] | Freq: Four times a day (QID) | INTRAMUSCULAR | Status: DC
  Administered 2023-11-08 (×2): 2 [IU] via SUBCUTANEOUS
  Administered 2023-11-09: 1 [IU] via SUBCUTANEOUS

## 2023-11-08 MED ORDER — SODIUM CHLORIDE 0.9 % IV SOLN
1250.0000 mg | Freq: Two times a day (BID) | INTRAVENOUS | Status: DC
  Administered 2023-11-09: 1250 mg via INTRAVENOUS
  Filled 2023-11-08: qty 25
  Filled 2023-11-08: qty 12.5

## 2023-11-08 MED ORDER — AMLODIPINE BESYLATE 10 MG PO TABS
10.0000 mg | ORAL_TABLET | Freq: Every day | ORAL | Status: DC
  Administered 2023-11-09: 10 mg via ORAL
  Filled 2023-11-08: qty 1

## 2023-11-08 MED ORDER — IRBESARTAN 300 MG PO TABS
300.0000 mg | ORAL_TABLET | Freq: Every day | ORAL | Status: DC
  Administered 2023-11-09: 300 mg via ORAL
  Filled 2023-11-08: qty 1

## 2023-11-08 MED ORDER — METOPROLOL TARTRATE 50 MG PO TABS
50.0000 mg | ORAL_TABLET | Freq: Every day | ORAL | Status: DC
  Administered 2023-11-08 – 2023-11-09 (×2): 50 mg via ORAL
  Filled 2023-11-08 (×2): qty 1

## 2023-11-08 MED ORDER — AMISULPRIDE (ANTIEMETIC) 5 MG/2ML IV SOLN: 10.0000 mg | Freq: Once | INTRAVENOUS | Status: DC | PRN

## 2023-11-08 MED ORDER — PHENYLEPHRINE 80 MCG/ML (10ML) SYRINGE FOR IV PUSH (FOR BLOOD PRESSURE SUPPORT)
PREFILLED_SYRINGE | INTRAVENOUS | Status: DC | PRN
  Administered 2023-11-08: 80 ug via INTRAVENOUS
  Administered 2023-11-08: 160 ug via INTRAVENOUS

## 2023-11-08 MED ORDER — PROPOFOL 10 MG/ML IV BOLUS
INTRAVENOUS | Status: AC
  Filled 2023-11-08: qty 20

## 2023-11-08 MED ORDER — FENTANYL CITRATE (PF) 100 MCG/2ML IJ SOLN: 25.0000 ug | INTRAMUSCULAR | Status: DC | PRN

## 2023-11-08 MED ORDER — CHLORHEXIDINE GLUCONATE 0.12 % MT SOLN: 15.0000 mL | Freq: Once | OROMUCOSAL | Status: AC

## 2023-11-08 MED ORDER — MELATONIN 3 MG PO TABS: 3.0000 mg | ORAL_TABLET | Freq: Every evening | ORAL | Status: DC | PRN

## 2023-11-08 MED ORDER — METRONIDAZOLE 500 MG/100ML IV SOLN
500.0000 mg | Freq: Once | INTRAVENOUS | Status: AC
  Administered 2023-11-08: 500 mg via INTRAVENOUS
  Filled 2023-11-08: qty 100

## 2023-11-08 MED ORDER — FENTANYL CITRATE (PF) 250 MCG/5ML IJ SOLN
INTRAMUSCULAR | Status: AC
  Filled 2023-11-08: qty 5

## 2023-11-08 MED ORDER — VANCOMYCIN HCL 1250 MG/250ML IV SOLN
1250.0000 mg | Freq: Two times a day (BID) | INTRAVENOUS | Status: DC
  Filled 2023-11-08 (×2): qty 250

## 2023-11-08 MED ORDER — LIDOCAINE 2% (20 MG/ML) 5 ML SYRINGE
INTRAMUSCULAR | Status: AC
  Filled 2023-11-08: qty 5

## 2023-11-08 SURGICAL SUPPLY — 43 items
BLADE AVERAGE 25X9 (BLADE) IMPLANT
BLADE SURG 10 STRL SS (BLADE) ×1 IMPLANT
BLADE SURG 15 STRL LF DISP TIS (BLADE) ×1 IMPLANT
BNDG COHESIVE 3X5 TAN ST LF (GAUZE/BANDAGES/DRESSINGS) ×1 IMPLANT
BNDG ELASTIC 3INX 5YD STR LF (GAUZE/BANDAGES/DRESSINGS) ×1 IMPLANT
BNDG ELASTIC 4INX 5YD STR LF (GAUZE/BANDAGES/DRESSINGS) IMPLANT
BNDG ESMARK 4X9 LF (GAUZE/BANDAGES/DRESSINGS) ×1 IMPLANT
BNDG GAUZE DERMACEA FLUFF 4 (GAUZE/BANDAGES/DRESSINGS) IMPLANT
CHLORAPREP W/TINT 26 (MISCELLANEOUS) IMPLANT
CNTNR URN SCR LID CUP LEK RST (MISCELLANEOUS) IMPLANT
DRSG ADAPTIC 3X8 NADH LF (GAUZE/BANDAGES/DRESSINGS) IMPLANT
DRSG XEROFORM 1X8 (GAUZE/BANDAGES/DRESSINGS) IMPLANT
ELECT REM PT RETURN 9FT ADLT (ELECTROSURGICAL) ×1 IMPLANT
ELECTRODE REM PT RTRN 9FT ADLT (ELECTROSURGICAL) ×1 IMPLANT
GAUZE PAD ABD 8X10 STRL (GAUZE/BANDAGES/DRESSINGS) IMPLANT
GAUZE SPONGE 2X2 STRL 8-PLY (GAUZE/BANDAGES/DRESSINGS) IMPLANT
GAUZE SPONGE 4X4 12PLY STRL (GAUZE/BANDAGES/DRESSINGS) ×1 IMPLANT
GAUZE STRETCH 2X75IN STRL (MISCELLANEOUS) ×1 IMPLANT
GAUZE XEROFORM 1X8 LF (GAUZE/BANDAGES/DRESSINGS) ×1 IMPLANT
GLOVE BIO SURGEON STRL SZ7.5 (GLOVE) ×1 IMPLANT
GLOVE BIOGEL PI IND STRL 7.5 (GLOVE) ×1 IMPLANT
GOWN STRL REUS W/ TWL LRG LVL3 (GOWN DISPOSABLE) ×2 IMPLANT
KIT BASIN OR (CUSTOM PROCEDURE TRAY) ×1 IMPLANT
NDL BIOPSY JAMSHIDI 8X6 (NEEDLE) IMPLANT
NDL HYPO 22X1.5 SAFETY MO (MISCELLANEOUS) IMPLANT
NDL HYPO 25X1 1.5 SAFETY (NEEDLE) ×1 IMPLANT
NEEDLE BIOPSY JAMSHIDI 8X6 (NEEDLE) IMPLANT
NEEDLE HYPO 22X1.5 SAFETY MO (MISCELLANEOUS) ×1 IMPLANT
NEEDLE HYPO 25X1 1.5 SAFETY (NEEDLE) IMPLANT
PACK ORTHO EXTREMITY (CUSTOM PROCEDURE TRAY) ×1 IMPLANT
PAD ABD 8X10 STRL (GAUZE/BANDAGES/DRESSINGS) IMPLANT
PADDING CAST ABS COTTON 4X4 ST (CAST SUPPLIES) ×2 IMPLANT
SET HNDPC FAN SPRY TIP SCT (DISPOSABLE) IMPLANT
SPIKE FLUID TRANSFER (MISCELLANEOUS) IMPLANT
STOCKINETTE 4X48 STRL (DRAPES) IMPLANT
SUT ETHILON 3 0 FSLX (SUTURE) IMPLANT
SUT PROLENE 3 0 PS 2 (SUTURE) IMPLANT
SUT PROLENE 4 0 PS 2 18 (SUTURE) IMPLANT
SYR CONTROL 10ML LL (SYRINGE) ×1 IMPLANT
TUBE CONNECTING 12X1/4 (SUCTIONS) IMPLANT
UNDERPAD 30X36 HEAVY ABSORB (UNDERPADS AND DIAPERS) ×1 IMPLANT
WATER STERILE IRR 1000ML POUR (IV SOLUTION) ×1 IMPLANT
YANKAUER SUCT BULB TIP NO VENT (SUCTIONS) IMPLANT

## 2023-11-08 NOTE — Progress Notes (Signed)
  Carryover admission to the Day Admitter.  I discussed this case with the EDP, Brooke Small, PA.  Per these discussions:   This is a 63 year old male with type 2 diabetes mellitus, who is being admitted with osteomyelitis of the bilateral feet after presenting from podiatry clinic with new wounds to the bilateral feet after wearing a new pair of boots over the course of the last 2 weeks.  MRI of the bilateral feet are reported to confirm osteomyelitis bilaterally.  Blood cultures x 2 were collected and the patient was started on IV vancomycin , Rocephin , Flagyl .  Dr. Rosemarie Conquest of podiatry is aware and is anticipating taking pt to OR today;  I have placed an order for inpatient admission to med/tele for further evaluation management of the above.  I have placed some additional preliminary admit orders via the adult multi-morbid admission order set. I have also ordered morning labs in the form of CMP, CBC, magnesium  level, INR, CRP, ESR.  I have kept the patient n.p.o. , ordered type and screen as well as preoperative EKG.  I have ordered IV vancomycin  and cefepime  for coverage of osteomyelitis.  I have also ordered prn IV fentanyl , hemoglobin A1c level, and every 6 hours CBG monitoring with associated sliding scale insulin .    Camelia Cavalier, DO Hospitalist

## 2023-11-08 NOTE — ED Provider Notes (Signed)
 [facility] Provider Note   CSN: [id number] Arrival date & time: 11/07/23  1330     History  Chief Complaint  Patient presents with   Foot Problem    infection    Kyle [name] is a 63 y.o. male, history of diabetes, here for wounds on bilateral feet, that been going on for the last 2 weeks.  2 weeks noticed that he had sores, on his third digit of his left foot, and his 2nd and 3rd digit of his right foot.  He went to see a PA at urgent care and was placed on doxycycline , but the wounds continue to get worse.  Saw podiatrist, today, and was told that he likely had a foot infection and need to go to the ER.  He states the wounds have gotten only worse, from starting the doxycycline , but denies any fevers.  Last A1c was 5.9%, and he has been compliant with his insulin .  He states the podiatrist said that he Will likely need an amputation.    Home Medications Prior to Admission medications   Medication Sig Start Date End Date Taking? Authorizing Provider  amLODipine  (NORVASC ) 10 MG tablet Take 10 mg by mouth daily. 04/25/21  Yes [provider]  brimonidine  (ALPHAGAN ) 0.2 % ophthalmic solution Place 1 drop into the right eye in the morning and at bedtime. 02/08/21  Yes [provider]  cholecalciferol  (VITAMIN D3) 25 MCG (1000 UNIT) tablet Take 1,000 Units by mouth daily. 01/11/16  Yes [provider]  dorzolamide -timolol  (COSOPT ) 22.3-6.8 MG/ML ophthalmic solution Place 1 drop into the right eye 2 (two) times daily.   Yes [provider]  doxycycline  (VIBRAMYCIN ) 100 MG capsule Take 1 capsule (100 mg total) by mouth 2 (two) times daily for 10 days. 11/04/23 11/14/23 Yes Leonides Ramp, FNP  gabapentin  (NEURONTIN ) 300 MG capsule 4 tablets twice a day, for 1 week, then decrease to 3 tablets twice a day for 1 week Patient taking differently: Take 600 mg by mouth 2 (two) times daily. 05/29/21  Yes Carlton Chick,  MD  insulin  glargine, 2 Unit Dial, (TOUJEO  MAX SOLOSTAR) 300 UNIT/ML Solostar Pen Inject 50 Units into the skin daily.   Yes [provider]  Magnesium  250 MG TABS Take 250 mg by mouth daily.   Yes [provider]  metoprolol  tartrate (LOPRESSOR ) 50 MG tablet Take 50 mg by mouth daily. 10/03/16  Yes [provider]  MOUNJARO 12.5 MG/0.5ML Pen SMARTSIG:12.5 Milligram(s) SUB-Q Once a Week   Yes [provider]  olmesartan (BENICAR) 40 MG tablet Take 40 mg by mouth daily.   Yes [provider]  OVER THE COUNTER MEDICATION Take 1 capsule by mouth daily. Physician's Choice 40 Billion Probiotic   Yes [provider]  RA KRILL OIL 500 MG CAPS Take 500 mg by mouth daily.   Yes [provider]  simvastatin  (ZOCOR ) 40 MG tablet Take 40 mg by mouth daily at 6 PM.  01/24/16  Yes [provider]  SYNJARDY XR 12.11-998 MG TB24 Take 1 tablet by mouth 2 (two) times daily.   Yes [provider]  vitamin B-12 (CYANOCOBALAMIN ) 500 MCG tablet Take 500 mcg by mouth daily.   Yes [provider]      Allergies    Acetazolamide, Hydrocodone, Penicillins, and Oxycodone    Review of Systems   Review of Systems  Physical Exam Updated Vital Signs BP (!) 171/108   Pulse 70  Temp (!) 97.5 F (36.4 C) (Oral)   Resp 15   SpO2 98%  Physical Exam Vitals and nursing note reviewed.  Constitutional:      General: He is not in acute distress.    Appearance: He is well-developed.  HENT:     Head: Normocephalic and atraumatic.  Eyes:     Conjunctiva/sclera: Conjunctivae normal.  Cardiovascular:     Rate and Rhythm: Normal rate and regular rhythm.     Heart sounds: No murmur heard. Pulmonary:     Effort: Pulmonary effort is normal. No respiratory distress.     Breath sounds: Normal breath sounds.  Abdominal:     Palpations: Abdomen is soft.     Tenderness: There is no abdominal tenderness.  Musculoskeletal:        General:  No swelling.     Cervical back: Neck supple.  Skin:    General: Skin is warm and dry.     Capillary Refill: Capillary refill takes less than 2 seconds.     Comments: Ulcerations present on third digit of left foot, 2nd and 3rd digit of right foot.  See picture for further details.  Purulent discharge present.  Overlying erythema, and edema, bilateral lower extremities  Neurological:     Mental Status: He is alert.  Psychiatric:        Mood and Affect: Mood normal.        ED Results / Procedures / Treatments   Labs (all labs ordered are listed, but only abnormal results are displayed) Labs Reviewed  BASIC METABOLIC PANEL WITH GFR - Abnormal; Notable for the following components:      Result Value   Glucose, Bld 118 (*)    All other components within normal limits  COMPREHENSIVE METABOLIC PANEL WITH GFR - Abnormal; Notable for the following components:   Glucose, Bld 103 (*)    Albumin 3.1 (*)    All other components within normal limits  C-REACTIVE PROTEIN - Abnormal; Notable for the following components:   CRP 2.5 (*)    All other components within normal limits  CBG MONITORING, ED - Abnormal; Notable for the following components:   Glucose-Capillary 58 (*)    All other components within normal limits  CULTURE, BLOOD (ROUTINE X 2)  CULTURE, BLOOD (ROUTINE X 2)  CBC WITH DIFFERENTIAL/PLATELET  CBC WITH DIFFERENTIAL/PLATELET  MAGNESIUM   HEMOGLOBIN A1C  PROTIME-INR  SEDIMENTATION RATE  TYPE AND SCREEN    EKG None  Radiology MR TOES LEFT WO CONTRAST Result Date: 11/07/2023 CLINICAL DATA:  Concern for osteomyelitis. EXAM: MRI OF THE LEFT TOES WITHOUT CONTRAST TECHNIQUE: Multiplanar, multisequence MR imaging of the left foot was performed. No intravenous contrast was administered. COMPARISON:  Left foot radiographs dated 11/07/2023. FINDINGS: Bones/Joint/Cartilage Marrow signal abnormality of the third digit, compatible with osteomyelitis, with cortical disruption of the  head of the third proximal phalanx and medial subluxation of third middle phalanx. Marrow signal abnormality of the second proximal and middle phalanges with mild corresponding T1 hypointensity is also concerning for osteomyelitis. No additional marrow signal abnormality identified elsewhere to suggest osteomyelitis. Remote healed fracture of the mid shaft of the first metatarsal surrounding chronic bridging callus. Mild-to-moderate degenerative changes of the TMT joints, most pronounced at the second and third TMT joints. Mild-to-moderate degenerative changes of the first MTP joint. Ligaments Collateral ligaments are intact.  Lisfranc ligament is intact. Muscles and Tendons There is atrophy and edema of the intrinsic musculature of the foot. No tenosynovitis. Soft tissue Soft tissue  ulceration/wound along the dorsal aspect of the second and third digits. No loculated fluid collection. Subcutaneous edema extending along the dorsal foot. IMPRESSION: 1. Osteomyelitis of the left third digit with cortical disruption at the head of the third proximal phalanx and medial subluxation of the third middle phalanx. 2. Findings concerning for osteomyelitis of the left second proximal and middle phalanges. 3. Soft tissue ulceration/wound along the dorsal aspect of the second and third digits. No loculated fluid collection. 4. Nonspecific subcutaneous edema extending along the dorsal foot, could reflect cellulitis. 5. Atrophy and edema of the intrinsic musculature of the foot. These findings may be secondary to chronic denervation changes, however, myositis can not be excluded. 6. Mild-to-moderate degenerative changes of the midfoot and forefoot. Electronically Signed   By: Mannie Seek M.D.   On: 11/07/2023 20:47   MR TOES RIGHT WO CONTRAST Result Date: 11/07/2023 CLINICAL DATA:  Concern for osteomyelitis. EXAM: MRI OF THE RIGHT TOES WITHOUT CONTRAST TECHNIQUE: Multiplanar, multisequence MR imaging of the right foot was  performed. No intravenous contrast was administered. COMPARISON:  Right foot radiographs dated 11/07/2023. FINDINGS: Bones/Joint/Cartilage Soft tissue wound along the dorsal aspect of the right third digit. Marrow signal abnormality of the third digit, most pronounced involving the third proximal and middle phalanges, compatible with osteomyelitis. No marrow signal abnormality identified elsewhere to suggest osteomyelitis. Mild-to-moderate degenerative changes of the midfoot and forefoot. Trace fifth MTP joint effusion. Ligaments Collateral ligaments are intact.  Lisfranc ligament is intact. Muscles and Tendons Mild atrophy and edema of the intrinsic musculature of the foot. Soft tissue No loculated fluid collection. Diffuse subcutaneous edema of the foot, most pronounced dorsally. Bursitis of the second intermetatarsal space IMPRESSION: 1. Findings compatible with osteomyelitis of the right third digit. 2. Diffuse nonspecific subcutaneous edema of the foot, most pronounced dorsally, could reflect cellulitis. No loculated fluid collection. 3. Mild atrophy and edema of the intrinsic musculature of the foot. These findings may be secondary to chronic denervation changes, however, myositis can not be excluded. 4. Second intermetatarsal space bursitis. 5. Mild-to-moderate degenerative changes of the midfoot and forefoot. Electronically Signed   By: Mannie Seek M.D.   On: 11/07/2023 20:33   DG Foot Complete Left Result Date: 11/07/2023 CLINICAL DATA:  Toe infection not improving, wounds on the bilateral feet. EXAM: LEFT FOOT - COMPLETE 3+ VIEW COMPARISON:  Radiograph 11/04/2023 FINDINGS: Cortical disruption and irregularity about the head of the third toe proximal phalanx has increased compared to 11/04/2023. Question impaction of the proximal phalanx into the middle phalanx. Medial subluxation of the middle phalanx of the third toe also increased. Soft tissue swelling about the foot and third toe. IMPRESSION:  Findings are concerning for septic arthritis/osteomyelitis at the third PIP joint. Question superimposed pathologic fracture. Electronically Signed   By: Rozell Cornet M.D.   On: 11/07/2023 17:28   DG Foot Complete Right Result Date: 11/07/2023 CLINICAL DATA:  toe infection, not improving. EXAM: RIGHT FOOT COMPLETE - 3+ VIEW COMPARISON:  11/04/2023. FINDINGS: No acute fracture or dislocation. No aggressive osseous lesion. Redemonstration of threaded metallic cortical screws overlying the medial and lateral malleoli, similar to the prior study. Mild-to-moderate diffuse degenerative changes of imaged joints. Calcaneal spur noted along the Achilles tendon and Plantar aponeurosis attachment sites. Mild diffuse soft tissue swelling over the dorsum of the forefoot. No radiopaque foreign bodies. IMPRESSION: No acute osseous abnormality of the right foot. Electronically Signed   By: Beula Brunswick M.D.   On: 11/07/2023 15:34    Procedures  Procedures    Medications Ordered in ED Medications  vancomycin  (VANCOREADY) IVPB 2000 mg/400 mL (2,000 mg Intravenous New Bag/Given 11/08/23 0438)  acetaminophen  (TYLENOL ) tablet 650 mg (has no administration in time range)    Or  acetaminophen  (TYLENOL ) suppository 650 mg (has no administration in time range)  melatonin tablet 3 mg (has no administration in time range)  ondansetron  (ZOFRAN ) injection 4 mg (has no administration in time range)  naloxone  (NARCAN ) injection 0.4 mg (has no administration in time range)  fentaNYL  (SUBLIMAZE ) injection 50 mcg (has no administration in time range)  insulin  aspart (novoLOG ) injection 0-6 Units ( Subcutaneous Not Given 11/08/23 0604)  ceFEPIme  (MAXIPIME ) 2 g in sodium chloride  0.9 % 100 mL IVPB (0 g Intravenous Stopped 11/08/23 0612)  dextrose  50 % solution 50 mL (50 mLs Intravenous Given 11/08/23 1610)  LORazepam  (ATIVAN ) tablet 1 mg (1 mg Oral Given 11/07/23 1739)  cefTRIAXone  (ROCEPHIN ) 2 g in sodium chloride  0.9 % 100 mL  IVPB (0 g Intravenous Stopped 11/08/23 0414)    And  metroNIDAZOLE  (FLAGYL ) IVPB 500 mg (0 mg Intravenous Stopped 11/08/23 0517)    ED Course/ Medical Decision Making/ A&P                                 Medical Decision Making Patient is a 63 year old male, here for evaluation, he has had wounds on his toes, for the last 2 weeks, he has obvious ulcerations, and purulent discharge.  He was sent by his podiatrist given concern for osteomyelitis.  MRI was ordered by triage, shows osteomyelitis of the second, third digits, of the left foot, and osteomyelitis of the third digit of the right foot.  Will start on IV antibiotics, and admit to hospitalist, for podiatry consult in the morning, for possible surgery  Amount and/or Complexity of Data Reviewed Radiology: ordered.    Details: MRI show osteomyelitis, of the left 2nd and 3rd digits, as well as the right third digit Discussion of management or test interpretation with external provider(s): Admitted to Dr. Brock Canner, for IV antibiotics, further podiatry consultation, and possible amputation.  He is well-appearing, afebrile, blood cultures were obtained, started on broad-spectrum antibiotics  Risk Prescription drug management. Decision regarding hospitalization.   Final Clinical Impression(s) / ED Diagnoses Final diagnoses:  Osteomyelitis of foot, unspecified laterality, unspecified type [facility])    Rx / DC Orders ED Discharge Orders     None         Kyle [name], Dwaine Gip, PA 11/08/23 9604    Edson Graces, MD 11/08/23 [phone]

## 2023-11-08 NOTE — Anesthesia Procedure Notes (Addendum)
 Procedure Name: Intubation Date/Time: 11/08/2023 11:18 AM  Performed by: Lemmie Steinhaus J, CRNAPre-anesthesia Checklist: Patient identified, Emergency Drugs available, Suction available and Patient being monitored Patient Re-evaluated:Patient Re-evaluated prior to induction Oxygen Delivery Method: Circle System Utilized Preoxygenation: Pre-oxygenation with 100% oxygen Induction Type: IV induction and Rapid sequence Laryngoscope Size: Glidescope and 4 Grade View: Grade I Tube type: Oral Tube size: 8.0 mm Number of attempts: 1 Airway Equipment and Method: Stylet and Oral airway Placement Confirmation: ETT inserted through vocal cords under direct vision, positive ETCO2 and breath sounds checked- equal and bilateral Secured at: 23 cm Tube secured with: Tape Dental Injury: Teeth and Oropharynx as per pre-operative assessment

## 2023-11-08 NOTE — Hospital Course (Signed)
 63 year old male with type 2 diabetes mellitus, hyperlipidemia, GERD, hypertension was sent from podiatry clinic with bilateral feet wounds after wearing new pair of boots for over the last 2 weeks.  Patient had noticed some sores on his third digit of his left foot and 2nd and 3rd digit of his right foot and went to urgent care and was put on doxycycline .  Despite this his wounds continue to get worse so went to see podiatrist who referred the patient back to the ED.  MRI of the feet bilaterally showed osteomyelitis of the left third digit with cortical disruption and findings concerning for osteomyelitis of the second left proximal and middle phalanges, osteomyelitis of the third toe.  MRI of the right toe showed osteomyelitis of the right third digit. Blood cultures x 2 were collected and the patient was started on IV vancomycin , Rocephin , Flagyl  in the ED and was considered for admission to the hospital. Dr. Malvin of podiatry was consulted.    Osteomyelitis of the left second and third digit and right third digit of the foot.  Podiatry has been notified.  Plan for right third toe and left 2nd and 3rd toe amputation..  Currently NPO.  Continue broad-spectrum antibiotic with cefepime  and vancomycin ..  CRP slightly elevated at 2.5.  No leukocytosis.  Diabetes mellitus type 2. Continue sliding scale insulin , Accu-Cheks, diabetic diet.  Latest hemoglobin A1c of 5.4  GERD  Hypertension.

## 2023-11-08 NOTE — Op Note (Addendum)
 Full Operative Report  Date of Operation: 11:05 AM, 11/08/2023   Patient: Kyle [name] - 63 y.o. male  Surgeon: Evertt Hoe, DPM   Assistant: None  Diagnosis: Osteomyelitis right second and left second and third toes  Procedure:  1.  Amputation of third toe at MPJ level, Right foot 2.  Amputation of second toe at MPJ level, Left foot 3.  Amputation of third toe at MPJ level, Left foot    Anesthesia: Anesthesia type not filed in the log.  No responsible provider has been recorded for the case.  Anesthesiologist: Conard Decent, MD CRNA: Basilio Limb, CRNA   Estimated Blood Loss: 10 mL  Hemostasis: 1) Anatomical dissection, mechanical compression, electrocautery 2) no tourniquet was used during procedure  Implants: * No implants in log *  Materials: Prolene 3-0  Injectables: 1) Pre-operatively: 20 cc of 50:50 mixture 1%lidocaine  plain and 0.5% marcaine  plain 2) Post-operatively: None   Specimens: Pathology: Right third toe, left second toe, left third toe microbiology: None   Antibiotics: IV antibiotics given per schedule on the floor  Drains: None  Complications: Patient tolerated the procedure well without complication.   Operative findings: As below in detailed report  Indications for Procedure: Kyle [name] presents to [facility], [place] with a chief complaint of ulceration with infection right third toe and left 2nd and 3rd toe.  MRI confirms finding of underlying osteomyelitis of these toes.  The patient has failed conservative treatments of various modalities. At this time the patient has elected to proceed with surgical correction. All alternatives, risks, and complications of the procedures were thoroughly explained to the patient. Patient exhibits appropriate understanding of all discussion points and informed consent was signed and obtained in the chart with no guarantees to surgical outcome given or  implied.  Description of Procedure: Patient was brought to the operating room. Patient remained on their hospital bed in the supine position. A surgical timeout was performed and all members of the operating room, the procedure, and the surgical site were identified. anesthesia occurred as per anesthesia record. Local anesthetic as previously described was then injected about the operative field in a local infiltrative block.  The operative lower extremity as noted above was then prepped and draped in the usual sterile manner. The following procedure then began.  Attention was directed to the 3rd digit on the RIGHT foot. A full-thickness incision encompassing the entire digit was made using a #15 blade. Dissection was carried down to bone. The toe was secured with a towel clamp, further dissected in its entirety, and disarticulated at the MPJ and passed to the back table as a gross specimen. This was then labled and sent to pathology. The bone was noted to be soft and eroded, and consistent with osteomyelitis. All remaining necrotic and devitalized soft tissue structures were visualized and dissected away using sharp and dull dissection. Care was taken to protect all neurovascular structures throughout the dissection. All bleeders were cauterized as necessary. The area was then flushed with copious amounts of sterile saline. Then using the suture materials previously described, the site was closed in anatomic layers and the skin was well approximated under minimal tension.  Attention was directed to the 2nd digit on the LEFT foot. A full-thickness incision encompassing the entire digit was made using a #15 blade. Dissection was carried down to bone. The toe was secured with a towel clamp, further dissected in its entirety, and disarticulated at the MPJ and passed to  the back table as a gross specimen. This was then labled and sent to pathology. The bone was noted to be soft and eroded, and consistent with  osteomyelitis. All remaining necrotic and devitalized soft tissue structures were visualized and dissected away using sharp and dull dissection. Care was taken to protect all neurovascular structures throughout the dissection. All bleeders were cauterized as necessary.  The area was then flushed with copious amounts of sterile saline. Then using the suture materials previously described, the site was closed in anatomic layers and the skin was well approximated under minimal tension.  Attention was directed to the 3rd digit on the LEFT foot. A full-thickness incision encompassing the entire digit was made using a #15 blade. Dissection was carried down to bone. The toe was secured with a towel clamp, further dissected in its entirety, and disarticulated at the MPJ and passed to the back table as a gross specimen. This was then labled and sent to pathology. The bone was noted to be soft and eroded, and consistent with osteomyelitis. All remaining necrotic and devitalized soft tissue structures were visualized and dissected away using sharp and dull dissection. Care was taken to protect all neurovascular structures throughout the dissection. All bleeders were cauterized as necessary.  The area was then flushed with copious amounts of sterile saline. Then using the suture materials previously described, the site was closed in anatomic layers and the skin was well approximated under minimal tension.   The surgical site was then dressed with Xeroform 4 x 4 Kerlix Ace. The patient tolerated both the procedure and anesthesia well with vital signs stable throughout. The patient was transferred in good condition and all vital signs stable  from the OR to recovery under the discretion of anesthesia.  Condition: Vital signs stable, neurovascular status unchanged from preoperative   Surgical plan:  Expect surgical cure of infection. Recommend 5 days Augmentin and doxycycline  on discharge. WBAT in post op shoe bilateral  for short distance/transfers. Likely stable for DC home tomorrow.   The patient will be weightbearing as tolerated in a postop shoe to the operative limb until further instructed. The dressing is to remain clean, dry, and intact. Will continue to follow unless noted elsewhere.   Russ Course, DPM [facility]

## 2023-11-08 NOTE — Transfer of Care (Signed)
 Immediate Anesthesia Transfer of Care Note  Patient: Katie Parks  Procedure(s) Performed: AMPUTATION,  RIGHT FOOT THIRD TOE ,LEFT FOOT SECOND AND THIRD TOE (Bilateral: Toe)  Patient Location: PACU  Anesthesia Type:General  Level of Consciousness: awake, alert , and oriented  Airway & Oxygen Therapy: Patient Spontanous Breathing and Patient connected to face mask oxygen  Post-op Assessment: Report given to RN and Post -op Vital signs reviewed and stable  Post vital signs: Reviewed and stable  Last Vitals:  Vitals Value Taken Time  BP 163/92 11/08/23 1204  Temp    Pulse 73 11/08/23 1206  Resp 15 11/08/23 1206  SpO2 95 % 11/08/23 1206  Vitals shown include unfiled device data.  Last Pain:  Vitals:   11/08/23 1054  TempSrc:   PainSc: 0-No pain         Complications: No notable events documented.

## 2023-11-08 NOTE — Progress Notes (Signed)
 Pharmacy Antibiotic Note  Kyle [name] is a 63 y.o. male presented on 11/07/2023 from podiatry clinic with concern for osteomyelitis.  MRI with osteomyelitis noted in multiple toes. Prior to admission on doxycycline  since 4/14 with no improvement. Pharmacy has been consulted for cefepime  and vancomycin  dosing.  Plan: Cefepime  2g q8h  Vancomycin  1250mg  q12h (eAUC 534, Scr 0.8, Vd 0.5) F/u renal function, infectious work up and length of therapy Vancomycin  levels as needed      Temp (24hrs), Avg:98.1 F (36.7 C), Min:97.7 F (36.5 C), Max:98.5 F (36.9 C)  Recent Labs  Lab 11/07/23 1446  WBC 8.2  CREATININE 0.84    Estimated Creatinine Clearance: 119.2 mL/min (by C-G formula based on SCr of 0.84 mg/dL).    Allergies  Allergen Reactions   Acetazolamide     Other reaction(s): Confusion   Hydrocodone     Other reaction(s): Hallucinations   Penicillins Rash    Has patient had a PCN reaction causing immediate rash, facial/tongue/throat swelling, SOB or lightheadedness with hypotension: YES Has patient had a PCN reaction causing severe rash involving mucus membranes or skin necrosis: NO Has patient had a PCN reaction that required hospitalization: NO Has patient had a PCN reaction occurring within the last 10 years: NO If all of the above answers are "NO", then may proceed with Cephalosporin use.   Oxycodone Other (See Comments)    Hallucinations    Thank you for allowing pharmacy to be a part of this patient's care.  Kyle [name] 11/08/2023 4:36 AM

## 2023-11-08 NOTE — Consult Note (Signed)
 PODIATRY CONSULTATION  NAME Kyle [name] MRN [id number] DOB 02-10-1961 DOA 11/07/2023   Reason for consult: Bilateral toe ulceration w/ infection  Attending/Consulting physician: L. Pokhrel MD  History of present illness: "63 year old male with type 2 diabetes mellitus, hyperlipidemia, GERD, hypertension was sent from podiatry clinic with bilateral feet wounds after wearing new pair of boots for over the last 2 weeks.  Patient had noticed some sores on his third digit of his left foot and 2nd and 3rd digit of his right foot and went to urgent care and was put on doxycycline .  Despite this his wounds continue to get worse so went to see podiatrist who referred the patient back to the ED, patient denied any shortness of breath, cough, chest pain, fever, chills or rigor.  Denies any dizziness, lightheadedness or syncope.  Denies any urinary urgency, frequency or dysuria.  Denies any changes in his bowel habits.  No sick contacts or recent travel.   MRI of the feet bilaterally showed osteomyelitis of the left third digit with cortical disruption and findings concerning for osteomyelitis of the second left proximal and middle phalanges, osteomyelitis of the third toe.  MRI of the right toe showed osteomyelitis of the right third digit. Blood cultures x 2 were collected and the patient was started on IV vancomycin , Rocephin , Flagyl  in the ED and was considered for admission to the hospital. Dr. Rosemarie Conquest of podiatry was consulted. "  Discussed with patient MRI findings and Plan for OR today. He is in agreement to proceed. All questions answered.   Past Medical History:  Diagnosis Date   Diabetes mellitus without complication (HCC)    Elevated cholesterol    GERD (gastroesophageal reflux disease)    History of kidney stones 2013   Hypertension    Pancreatitis age 74   with mumps, no problems since   PONV (postoperative nausea and vomiting) yrs ago       Latest Ref Rng & Units 11/08/2023     5:19 AM 11/07/2023    2:46 PM 05/29/2021    8:48 AM  CBC  WBC 4.0 - 10.5 K/uL 6.1  8.2  9.5   Hemoglobin 13.0 - 17.0 g/dL 03.4  74.2  59.5   Hematocrit 39.0 - 52.0 % 50.6  47.7  55.0   Platelets 150 - 400 K/uL 270  293  240        Latest Ref Rng & Units 11/08/2023    5:19 AM 11/07/2023    2:46 PM 05/29/2021    8:48 AM  BMP  Glucose 70 - 99 mg/dL 638  756  433   BUN 6 - 20 mg/dL 16  14  16    Creatinine 0.61 - 1.24 mg/dL 2.95  1.88  4.16   Sodium 135 - 145 mmol/L 141  141  139   Potassium 3.5 - 5.1 mmol/L 3.8  4.5  3.5   Chloride 98 - 111 mmol/L 105  108  99   CO2 22 - 32 mmol/L 24  22  26    Calcium  8.9 - 10.3 mg/dL 9.7  9.5  60.6       Physical Exam: Lower Extremity Exam Vasc: R - PT palpable, DP palpable. Cap refill < 3 sec to digits  L - PT palpable, DP palpable. Cap refill <3 sec to digits  Derm: R - Erythema and edema of right 3rd toe with ulceration    L - Ulceration dorsal 2nd and 3rd toe, erythema and edema   MSK:  R -  No gross deformities. Compartments soft, non-tender, compressible  L -  No gross deformities. Compartments soft, non-tender, compressible  Neuro: R - Gross sensation absent at toes. Gross motor function intact   L - Gross sensation absent at  toes. Gross motor function intact    ASSESSMENT/PLAN OF CARE 63 y.o. male with PMHx significant for type 2 diabetes mellitus, hyperlipidemia, GERD, hypertension with osteomyelitis of right 3rd and Left 2nd and 3rd toes.   MRI R foot:  Findings compatible with osteomyelitis of the right third digit.   MRI L foot:  Osteomyelitis of the left third digit with cortical disruption at the head of the third proximal phalanx and medial subluxation of the third middle phalanx.  - NPO for OR today for Right 3rd and Left 2nd and 3rd toe amputation at MPJ level. Pt agrees to proceed.  - Continue IV abx broad spectrum pending further culture data - Anticoagulation: hold prior to OR - Wound care: Leave surgical  dressings c/d/I post op - WB status: WBAT in post op shoe bilateral foot post op - Will continue to follow   Thank you for the consult.  Please contact me directly with any questions or concerns.           Maridee Shoemaker, DPM [facility] / [facility]    [address] [place], [place] [address]                Office [phone]  Fax [phone]

## 2023-11-08 NOTE — Anesthesia Preprocedure Evaluation (Addendum)
 Anesthesia Evaluation  Patient identified by MRN, date of birth, ID band Patient awake    Reviewed: Allergy & Precautions, NPO status , Patient's Chart, lab work & pertinent test results  History of Anesthesia Complications (+) PONV and history of anesthetic complications  Airway Mallampati: III  TM Distance: >3 FB Neck ROM: Full    Dental  (+) Dental Advisory Given   Pulmonary neg pulmonary ROS   Pulmonary exam normal breath sounds clear to auscultation       Cardiovascular hypertension (amlodipine , metoprolol , olmesartan), Pt. on medications and Pt. on home beta blockers (-) angina (-) Past MI, (-) Cardiac Stents and (-) CABG (-) dysrhythmias  Rhythm:Regular Rate:Normal  HLD   Neuro/Psych negative neurological ROS     GI/Hepatic Neg liver ROS,GERD  ,,H/o pancreatitis at age 63   Endo/Other  diabetes, Type 2, Insulin  Dependent    Renal/GU negative Renal ROS     Musculoskeletal   Abdominal  (+) + obese  Peds  Hematology negative hematology ROS (+) Lab Results      Component                Value               Date                      WBC                      6.1                 11/08/2023                HGB                      17.0                11/08/2023                HCT                      50.6                11/08/2023                MCV                      95.5                11/08/2023                PLT                      270                 11/08/2023              Anesthesia Other Findings Last Mounjaro: 11/04/2023    Reproductive/Obstetrics                             Anesthesia Physical Anesthesia Plan  ASA: 3  Anesthesia Plan: General   Post-op Pain Management: Tylenol  PO (pre-op)*   Induction: Intravenous and Rapid sequence  PONV Risk Score and Plan: 3 and Ondansetron , Dexamethasone  and Treatment may vary due to age or medical condition  Airway Management  Planned: Oral ETT  Additional Equipment:  Intra-op Plan:   Post-operative Plan:   Informed Consent: I have reviewed the patients History and Physical, chart, labs and discussed the procedure including the risks, benefits and alternatives for the proposed anesthesia with the patient or authorized representative who has indicated his/her understanding and acceptance.     Dental advisory given  Plan Discussed with: CRNA and Anesthesiologist  Anesthesia Plan Comments: (Discussed with patient risks of MAC including, but not limited to, minor pain or discomfort, hearing people in the room, and possible need for backup general anesthesia. Risks for general anesthesia also discussed including, but not limited to, sore throat, hoarse voice, chipped/damaged teeth, injury to vocal cords, nausea and vomiting, allergic reactions, lung infection, heart attack, stroke, and death. All questions answered. )        Anesthesia Quick Evaluation

## 2023-11-08 NOTE — Progress Notes (Signed)
 Orthopedic Tech Progress Note Patient Details:  Kyle [name] [birth year] [id number]  Ortho Devices Type of Ortho Device: Postop shoe/boot Ortho Device/Splint Location: BLE Ortho Device/Splint Interventions: Ordered, Application, Adjustment, Removal   Post Interventions Patient Tolerated: Well Instructions Provided: Care of device  Kermitt Pedlar 11/08/2023, 12:19 PM

## 2023-11-08 NOTE — Anesthesia Postprocedure Evaluation (Signed)
 Anesthesia Post Note  Patient: Kyle [name]  Procedure(s) Performed: AMPUTATION,  RIGHT FOOT THIRD TOE ,LEFT FOOT SECOND AND THIRD TOE (Bilateral: Toe)     Patient location during evaluation: PACU Anesthesia Type: General Level of consciousness: awake Pain management: pain level controlled Vital Signs Assessment: post-procedure vital signs reviewed and stable Respiratory status: spontaneous breathing, nonlabored ventilation and respiratory function stable Cardiovascular status: blood pressure returned to baseline and stable Postop Assessment: no apparent nausea or vomiting Anesthetic complications: no   No notable events documented.  Last Vitals:  Vitals:   11/08/23 1253 11/08/23 1254  BP: (!) 172/97 (!) 163/93  Pulse: 66   Resp: 17   Temp: 36.6 C   SpO2:  95%    Last Pain:  Vitals:   11/08/23 1253  TempSrc: Oral  PainSc:                  [name]

## 2023-11-08 NOTE — ED Notes (Signed)
 Attempted 2nd PIV to collect 2nd set of blood cultures. Unsuccessful, phlebotomy notified

## 2023-11-08 NOTE — H&P (Signed)
 [facility] History and Physical  Kyle [name] WUJ:[id number] DOB: 1961/03/31 DOA: 11/07/2023  Referring physician: ED  PCP: Patient, No Pcp Per   Patient is coming from: Home  Chief Complaint: Foot infection  HPI:  63 year old male with type 2 diabetes mellitus, hyperlipidemia, GERD, hypertension was sent from podiatry clinic with bilateral feet wounds after wearing new pair of boots for over the last 2 weeks.  Patient had noticed some sores on his third digit of his left foot and 2nd and 3rd digit of his right foot and went to urgent care and was put on doxycycline .  Despite this his wounds continue to get worse so went to see podiatrist who referred the patient back to the ED, patient denied any shortness of breath, cough, chest pain, fever, chills or rigor.  Denies any dizziness, lightheadedness or syncope.  Denies any urinary urgency, frequency or dysuria.  Denies any changes in his bowel habits.  No sick contacts or recent travel.   MRI of the feet bilaterally showed osteomyelitis of the left third digit with cortical disruption and findings concerning for osteomyelitis of the second left proximal and middle phalanges, osteomyelitis of the third toe.  MRI of the right toe showed osteomyelitis of the right third digit. Blood cultures x 2 were collected and the patient was started on IV vancomycin , Rocephin , Flagyl  in the ED and was considered for admission to the hospital. Dr. Rosemarie Conquest of podiatry was consulted.     Assessment and Plan Principal Problem:   Osteomyelitis of right foot (HCC) Active Problems:   DM (diabetes mellitus) type II controlled, neurological manifestation (HCC)   Essential hypertension  Osteomyelitis of the left second and third digit and right third digit of the foot.   Podiatry has been notified.  Plan for right third toe and left 2nd and 3rd toe amputation.  Currently NPO.  Continue broad-spectrum antibiotic with cefepime  and vancomycin ..  CRP  slightly elevated at 2.5.  No leukocytosis.  Diabetes mellitus type 2. Continue sliding scale insulin , Accu-Cheks, diabetic diet.  Latest hemoglobin A1c of 5.4  GERD Will add Protonix for now.  Hypertension. Patient is on amlodipine , metoprolol  and Benicar at home.  Will resume while in the hospital.  There is no height or weight on file to calculate BMI.   DVT Prophylaxis: None for potential surgery.  Review of Systems:  All systems were reviewed and were negative unless otherwise mentioned in the HPI   Past Medical History:  Diagnosis Date   Diabetes mellitus without complication (HCC)    Elevated cholesterol    GERD (gastroesophageal reflux disease)    History of kidney stones 2013   Hypertension    Pancreatitis age 58   with mumps, no problems since   PONV (postoperative nausea and vomiting) yrs ago   Past Surgical History:  Procedure Laterality Date   ankle surgery for fx Right 24 yrs ago   COLONOSCOPY WITH PROPOFOL  N/A 02/20/2016   Procedure: COLONOSCOPY WITH PROPOFOL ;  Surgeon: Garrett Kallman, MD;  Location: [facility];  Service: Endoscopy;  Laterality: N/A;   cyst removed between legs  11/2013   EXTRACORPOREAL SHOCK WAVE LITHOTRIPSY  2013    Social History:  reports that he has never smoked. His smokeless tobacco use includes snuff. He reports that he does not drink alcohol and does not use drugs.  Allergies  Allergen Reactions   Acetazolamide     Other reaction(s): Confusion   Hydrocodone     Other reaction(s): Hallucinations  Penicillins Rash    Has patient had a PCN reaction causing immediate rash, facial/tongue/throat swelling, SOB or lightheadedness with hypotension: YES Has patient had a PCN reaction causing severe rash involving mucus membranes or skin necrosis: NO Has patient had a PCN reaction that required hospitalization: NO Has patient had a PCN reaction occurring within the last 10 years: NO If all of the above answers are "NO", then may  proceed with Cephalosporin use.   Oxycodone Other (See Comments)    Hallucinations    Family History  Problem Relation Age of Onset   Pancreatic cancer Mother    Alzheimer's disease Father      Prior to Admission medications   Medication Sig Start Date End Date Taking? Authorizing Provider  amLODipine  (NORVASC ) 10 MG tablet Take 10 mg by mouth daily. 04/25/21  Yes [provider]  brimonidine  (ALPHAGAN ) 0.2 % ophthalmic solution Place 1 drop into the right eye in the morning and at bedtime. 02/08/21  Yes [provider]  cholecalciferol  (VITAMIN D3) 25 MCG (1000 UNIT) tablet Take 1,000 Units by mouth daily. 01/11/16  Yes [provider]  dorzolamide -timolol  (COSOPT ) 22.3-6.8 MG/ML ophthalmic solution Place 1 drop into the right eye 2 (two) times daily.   Yes [provider]  doxycycline  (VIBRAMYCIN ) 100 MG capsule Take 1 capsule (100 mg total) by mouth 2 (two) times daily for 10 days. 11/04/23 11/14/23 Yes Leonides Ramp, FNP  gabapentin  (NEURONTIN ) 300 MG capsule 4 tablets twice a day, for 1 week, then decrease to 3 tablets twice a day for 1 week Patient taking differently: Take 600 mg by mouth 2 (two) times daily. 05/29/21  Yes Carlton Chick, MD  insulin  glargine, 2 Unit Dial, (TOUJEO  MAX SOLOSTAR) 300 UNIT/ML Solostar Pen Inject 50 Units into the skin daily.   Yes [provider]  Magnesium  250 MG TABS Take 250 mg by mouth daily.   Yes [provider]  metoprolol  tartrate (LOPRESSOR ) 50 MG tablet Take 50 mg by mouth daily. 10/03/16  Yes [provider]  MOUNJARO 12.5 MG/0.5ML Pen SMARTSIG:12.5 Milligram(s) SUB-Q Once a Week   Yes [provider]  olmesartan (BENICAR) 40 MG tablet Take 40 mg by mouth daily.   Yes [provider]  OVER THE COUNTER MEDICATION Take 1 capsule by mouth daily. Physician's Choice 40 Billion Probiotic   Yes [provider]  RA KRILL OIL 500 MG CAPS Take 500 mg by mouth daily.    Yes [provider]  simvastatin  (ZOCOR ) 40 MG tablet Take 40 mg by mouth daily at 6 PM.  01/24/16  Yes [provider]  SYNJARDY XR 12.11-998 MG TB24 Take 1 tablet by mouth 2 (two) times daily.   Yes [provider]  vitamin B-12 (CYANOCOBALAMIN ) 500 MCG tablet Take 500 mcg by mouth daily.   Yes [provider]    Physical Exam:  Vitals:   11/08/23 0530 11/08/23 0530 11/08/23 0700 11/08/23 0755  BP: (!) 171/108  (!) 148/96 (!) 169/94  Pulse: 70  73 75  Resp: 15  19 18   Temp:  (!) 97.5 F (36.4 C)  97.8 F (36.6 C)  TempSrc:  Oral  Oral  SpO2: 98%  96% 100%   Wt Readings from Last 3 Encounters:  11/04/23 115.7 kg  05/19/19 (!) 143.4 kg  02/20/16 122.9 kg   There is no height or weight on file to calculate BMI.  General:  Average built, not in obvious distress, obese build. HENT: Normocephalic, No scleral  pallor or icterus noted. Oral mucosa is moist.  Chest:  Clear breath sounds.  . No crackles or wheezes.  CVS: S1 &S2 heard. No murmur.  Regular rate and rhythm. Abdomen: Soft, nontender, nondistended.  Bowel sounds are heard. No abdominal mass palpated Extremities: No cyanosis, clubbing bilateral foot with ulceration. Psych: Alert, awake and oriented, normal mood CNS:  No cranial nerve deficits.  Power equal in all extremities.   Skin: Warm and dry.  No rashes noted.  .     Labs on Admission:   CBC: Recent Labs  Lab 11/07/23 1446 11/08/23 0519  WBC 8.2 6.1  NEUTROABS 5.8 3.6  HGB 15.9 17.0  HCT 47.7 50.6  MCV 93.5 95.5  PLT 293 270    Basic Metabolic Panel: Recent Labs  Lab 11/07/23 1446 11/08/23 0519  NA 141 141  K 4.5 3.8  CL 108 105  CO2 22 24  GLUCOSE 118* 103*  BUN 14 16  CREATININE 0.84 0.88  CALCIUM  9.5 9.7  MG  --  1.9    Liver Function Tests: Recent Labs  Lab 11/08/23 0519  AST 21  ALT 18  ALKPHOS 64  BILITOT 0.6  PROT 7.1  ALBUMIN 3.1*   No results for input(s): "LIPASE", "AMYLASE" in the  last 168 hours. No results for input(s): "AMMONIA" in the last 168 hours.  Cardiac Enzymes: No results for input(s): "CKTOTAL", "CKMB", "CKMBINDEX", "TROPONINI" in the last 168 hours.  BNP (last 3 results) No results for input(s): "BNP" in the last 8760 hours.  ProBNP (last 3 results) No results for input(s): "PROBNP" in the last 8760 hours.  CBG: Recent Labs  Lab 11/08/23 0557 11/08/23 0630 11/08/23 0801  GLUCAP 58* 122* 96    Lipase     Component Value Date/Time   LIPASE 24 05/29/2021 0848     Urinalysis    Component Value Date/Time   COLORURINE YELLOW 05/29/2021 1230   APPEARANCEUR CLEAR 05/29/2021 1230   LABSPEC 1.036 (H) 05/29/2021 1230   PHURINE 5.0 05/29/2021 1230   GLUCOSEU >=500 (A) 05/29/2021 1230   HGBUR NEGATIVE 05/29/2021 1230   BILIRUBINUR NEGATIVE 05/29/2021 1230   KETONESUR 20 (A) 05/29/2021 1230   PROTEINUR 30 (A) 05/29/2021 1230   NITRITE NEGATIVE 05/29/2021 1230   LEUKOCYTESUR NEGATIVE 05/29/2021 1230     Drugs of Abuse  No results found for: "LABOPIA", "COCAINSCRNUR", "LABBENZ", "AMPHETMU", "THCU", "LABBARB"    Radiological Exams on Admission: MR TOES LEFT WO CONTRAST Result Date: 11/07/2023 CLINICAL DATA:  Concern for osteomyelitis. EXAM: MRI OF THE LEFT TOES WITHOUT CONTRAST TECHNIQUE: Multiplanar, multisequence MR imaging of the left foot was performed. No intravenous contrast was administered. COMPARISON:  Left foot radiographs dated 11/07/2023. FINDINGS: Bones/Joint/Cartilage Marrow signal abnormality of the third digit, compatible with osteomyelitis, with cortical disruption of the head of the third proximal phalanx and medial subluxation of third middle phalanx. Marrow signal abnormality of the second proximal and middle phalanges with mild corresponding T1 hypointensity is also concerning for osteomyelitis. No additional marrow signal abnormality identified elsewhere to suggest osteomyelitis. Remote healed fracture of the mid shaft of the  first metatarsal surrounding chronic bridging callus. Mild-to-moderate degenerative changes of the TMT joints, most pronounced at the second and third TMT joints. Mild-to-moderate degenerative changes of the first MTP joint. Ligaments Collateral ligaments are intact.  Lisfranc ligament is intact. Muscles and Tendons There is atrophy and edema of the intrinsic musculature of the foot. No tenosynovitis. Soft tissue Soft tissue ulceration/wound along the dorsal aspect of  the second and third digits. No loculated fluid collection. Subcutaneous edema extending along the dorsal foot. IMPRESSION: 1. Osteomyelitis of the left third digit with cortical disruption at the head of the third proximal phalanx and medial subluxation of the third middle phalanx. 2. Findings concerning for osteomyelitis of the left second proximal and middle phalanges. 3. Soft tissue ulceration/wound along the dorsal aspect of the second and third digits. No loculated fluid collection. 4. Nonspecific subcutaneous edema extending along the dorsal foot, could reflect cellulitis. 5. Atrophy and edema of the intrinsic musculature of the foot. These findings may be secondary to chronic denervation changes, however, myositis can not be excluded. 6. Mild-to-moderate degenerative changes of the midfoot and forefoot. Electronically Signed   By: Mannie Seek M.D.   On: 11/07/2023 20:47   MR TOES RIGHT WO CONTRAST Result Date: 11/07/2023 CLINICAL DATA:  Concern for osteomyelitis. EXAM: MRI OF THE RIGHT TOES WITHOUT CONTRAST TECHNIQUE: Multiplanar, multisequence MR imaging of the right foot was performed. No intravenous contrast was administered. COMPARISON:  Right foot radiographs dated 11/07/2023. FINDINGS: Bones/Joint/Cartilage Soft tissue wound along the dorsal aspect of the right third digit. Marrow signal abnormality of the third digit, most pronounced involving the third proximal and middle phalanges, compatible with osteomyelitis. No marrow  signal abnormality identified elsewhere to suggest osteomyelitis. Mild-to-moderate degenerative changes of the midfoot and forefoot. Trace fifth MTP joint effusion. Ligaments Collateral ligaments are intact.  Lisfranc ligament is intact. Muscles and Tendons Mild atrophy and edema of the intrinsic musculature of the foot. Soft tissue No loculated fluid collection. Diffuse subcutaneous edema of the foot, most pronounced dorsally. Bursitis of the second intermetatarsal space IMPRESSION: 1. Findings compatible with osteomyelitis of the right third digit. 2. Diffuse nonspecific subcutaneous edema of the foot, most pronounced dorsally, could reflect cellulitis. No loculated fluid collection. 3. Mild atrophy and edema of the intrinsic musculature of the foot. These findings may be secondary to chronic denervation changes, however, myositis can not be excluded. 4. Second intermetatarsal space bursitis. 5. Mild-to-moderate degenerative changes of the midfoot and forefoot. Electronically Signed   By: Mannie Seek M.D.   On: 11/07/2023 20:33   DG Foot Complete Left Result Date: 11/07/2023 CLINICAL DATA:  Toe infection not improving, wounds on the bilateral feet. EXAM: LEFT FOOT - COMPLETE 3+ VIEW COMPARISON:  Radiograph 11/04/2023 FINDINGS: Cortical disruption and irregularity about the head of the third toe proximal phalanx has increased compared to 11/04/2023. Question impaction of the proximal phalanx into the middle phalanx. Medial subluxation of the middle phalanx of the third toe also increased. Soft tissue swelling about the foot and third toe. IMPRESSION: Findings are concerning for septic arthritis/osteomyelitis at the third PIP joint. Question superimposed pathologic fracture. Electronically Signed   By: Rozell Cornet M.D.   On: 11/07/2023 17:28   DG Foot Complete Right Result Date: 11/07/2023 CLINICAL DATA:  toe infection, not improving. EXAM: RIGHT FOOT COMPLETE - 3+ VIEW COMPARISON:  11/04/2023.  FINDINGS: No acute fracture or dislocation. No aggressive osseous lesion. Redemonstration of threaded metallic cortical screws overlying the medial and lateral malleoli, similar to the prior study. Mild-to-moderate diffuse degenerative changes of imaged joints. Calcaneal spur noted along the Achilles tendon and Plantar aponeurosis attachment sites. Mild diffuse soft tissue swelling over the dorsum of the forefoot. No radiopaque foreign bodies. IMPRESSION: No acute osseous abnormality of the right foot. Electronically Signed   By: Beula Brunswick M.D.   On: 11/07/2023 15:34    EKG: Normal sinus rhythm  Consultant: Podiatry  Code Status: Full code  Microbiology blood cultures  Antibiotics: Vancomycin  cefepime  and Flagyl .  Family Communication:  Patients' condition and plan of care including tests being ordered have been discussed with the patient  who indicate understanding and agree with the plan.   Status is: Inpatient Remains inpatient appropriate because: IV antibiotics, need for amputation, osteomyelitis   Severity of Illness: The appropriate patient status for this patient is INPATIENT. Inpatient status is judged to be reasonable and necessary in order to provide the required intensity of service to ensure the patient's safety. The patient's presenting symptoms, physical exam findings, and initial radiographic and laboratory data in the context of their chronic comorbidities is felt to place them at high risk for further clinical deterioration. Furthermore, it is not anticipated that the patient will be medically stable for discharge from the hospital within 2 midnights of admission.   * I certify that at the point of admission it is my clinical judgment that the patient will require inpatient hospital care spanning beyond 2 midnights from the point of admission due to high intensity of service, high risk for further deterioration and high frequency of surveillance  required.*  Signed, Rosena Conradi, MD [facility] 11/08/2023

## 2023-11-09 ENCOUNTER — Encounter (HOSPITAL_COMMUNITY): Payer: Self-pay | Admitting: Podiatry

## 2023-11-09 DIAGNOSIS — M86171 Other acute osteomyelitis, right ankle and foot: Secondary | ICD-10-CM | POA: Diagnosis not present

## 2023-11-09 LAB — CBC
HCT: 46.5 % (ref 39.0–52.0)
Hemoglobin: 15.6 g/dL (ref 13.0–17.0)
MCH: 31.1 pg (ref 26.0–34.0)
MCHC: 33.5 g/dL (ref 30.0–36.0)
MCV: 92.6 fL (ref 80.0–100.0)
Platelets: 280 10*3/uL (ref 150–400)
RBC: 5.02 MIL/uL (ref 4.22–5.81)
RDW: 12.8 % (ref 11.5–15.5)
WBC: 11.4 10*3/uL — ABNORMAL HIGH (ref 4.0–10.5)
nRBC: 0 % (ref 0.0–0.2)

## 2023-11-09 LAB — COMPREHENSIVE METABOLIC PANEL WITH GFR
ALT: 15 U/L (ref 0–44)
AST: 14 U/L — ABNORMAL LOW (ref 15–41)
Albumin: 2.8 g/dL — ABNORMAL LOW (ref 3.5–5.0)
Alkaline Phosphatase: 59 U/L (ref 38–126)
Anion gap: 12 (ref 5–15)
BUN: 15 mg/dL (ref 6–20)
CO2: 24 mmol/L (ref 22–32)
Calcium: 9.4 mg/dL (ref 8.9–10.3)
Chloride: 105 mmol/L (ref 98–111)
Creatinine, Ser: 0.76 mg/dL (ref 0.61–1.24)
GFR, Estimated: >60 mL/min (ref >60)
Glucose, Bld: 174 mg/dL — ABNORMAL HIGH (ref 70–99)
Potassium: 4.1 mmol/L (ref 3.5–5.1)
Sodium: 141 mmol/L (ref 135–145)
Total Bilirubin: 0.4 mg/dL (ref 0.0–1.2)
Total Protein: 6.2 g/dL — ABNORMAL LOW (ref 6.5–8.1)

## 2023-11-09 LAB — GLUCOSE, CAPILLARY
Glucose-Capillary: 188 mg/dL — ABNORMAL HIGH (ref 70–99)
Glucose-Capillary: 196 mg/dL — ABNORMAL HIGH (ref 70–99)
Glucose-Capillary: 210 mg/dL — ABNORMAL HIGH (ref 70–99)
Glucose-Capillary: 150 mg/dL — ABNORMAL HIGH (ref 70–99)

## 2023-11-09 LAB — MAGNESIUM: Magnesium: 1.9 mg/dL (ref 1.7–2.4)

## 2023-11-09 LAB — PHOSPHORUS: Phosphorus: 2.8 mg/dL (ref 2.5–4.6)

## 2023-11-09 MED ORDER — HYDRALAZINE HCL 20 MG/ML IJ SOLN: 10.0000 mg | Freq: Four times a day (QID) | INTRAMUSCULAR | Status: DC | PRN

## 2023-11-09 MED ORDER — CEPHALEXIN 500 MG PO CAPS: 500.0000 mg | ORAL_CAPSULE | Freq: Four times a day (QID) | ORAL | 0 refills | Status: AC

## 2023-11-09 MED ORDER — CEPHALEXIN 500 MG PO CAPS
500.0000 mg | ORAL_CAPSULE | Freq: Four times a day (QID) | ORAL | Status: DC
  Administered 2023-11-09: 500 mg via ORAL
  Filled 2023-11-09: qty 1

## 2023-11-09 MED ORDER — INSULIN ASPART 100 UNIT/ML IJ SOLN
0.0000 [IU] | Freq: Three times a day (TID) | INTRAMUSCULAR | Status: DC
  Administered 2023-11-09: 5 [IU] via SUBCUTANEOUS
  Administered 2023-11-09: 2 [IU] via SUBCUTANEOUS

## 2023-11-09 NOTE — Plan of Care (Signed)

## 2023-11-09 NOTE — Evaluation (Signed)
 Occupational Therapy Evaluation Patient Details Name: Kyle [name] MRN: [id number] DOB: 08-15-1960 Today's Date: 11/09/2023   History of Present Illness   63 year old male with type 2 DM, hyperlipidemia, GERD, HTN was sent from podiatry clinic with bilateral feet wounds after wearing new pair of boots for over the last 2 weeks. Found to have Osteomyelitis. Pt is now s/p  left third and right 2nd and 3rd toe amputations.     Clinical Impressions Pt admitted with the above diagnoses and presents with below problem list. Pt will benefit from continued acute OT to address the below listed deficits and maximize independence with basic ADLs prior to d/c home. At baseline, pt is independent with ADLs, works in an office setting, drives. Pt currently needs CGA with LB ADLs and functional transfers/mobility. Noted to seek single UE support to walk in the room. Pt plans to have spouse confirm that he has a cane at home. Pt with 2 steps to enter home, rail on right. Tolerated OT session well. Up in recliner eating lunch at end of session.      If plan is discharge home, recommend the following:   A little help with walking and/or transfers;Assist for transportation;Help with stairs or ramp for entrance     Functional Status Assessment   Patient has had a recent decline in their functional status and demonstrates the ability to make significant improvements in function in a reasonable and predictable amount of time.     Equipment Recommendations   None recommended by OT     Recommendations for Other Services   PT consult     Precautions/Restrictions   Precautions Required Braces or Orthoses: Other Brace Other Brace: bilateral surgical shoes Restrictions Weight Bearing Restrictions Per Provider Order: Yes RLE Weight Bearing Per Provider Order: Weight bearing as tolerated LLE Weight Bearing Per Provider Order: Weight bearing as tolerated Other Position/Activity Restrictions:  bilateral surgical shoes     Mobility Bed Mobility Overal bed mobility: Modified Independent                  Transfers Overall transfer level: Needs assistance Equipment used: None Transfers: Sit to/from Stand Sit to Stand: Contact guard assist           General transfer comment: CGA for safety, first time standing postop.      Balance Overall balance assessment: Needs assistance Sitting-balance support: No upper extremity supported, Feet supported Sitting balance-Leahy Scale: Good     Standing balance support: Single extremity supported, During functional activity Standing balance-Leahy Scale: Poor Standing balance comment: fair static, poor dynamic                           ADL either performed or assessed with clinical judgement   ADL Overall ADL's : Needs assistance/impaired Eating/Feeding: Independent   Grooming: Set up;Sitting   Upper Body Bathing: Set up;Sitting   Lower Body Bathing: Contact guard assist;Sit to/from stand   Upper Body Dressing : Set up;Sitting   Lower Body Dressing: Contact guard assist;Sit to/from stand   Toilet Transfer: Contact guard assist;Ambulation   Toileting- Clothing Manipulation and Hygiene: Contact guard assist;Sit to/from stand       Functional mobility during ADLs: Contact guard assist General ADL Comments: Pt used no AD but noted to seek single UE external support of furniture to walk in the room.     Vision         Perception  Praxis         Pertinent Vitals/Pain Pain Assessment Pain Assessment: No/denies pain     Extremity/Trunk Assessment Upper Extremity Assessment Upper Extremity Assessment: Overall WFL for tasks assessed   Lower Extremity Assessment Lower Extremity Assessment: Defer to PT evaluation       Communication Communication Communication: No apparent difficulties   Cognition Arousal: Alert Behavior During Therapy: WFL for tasks  assessed/performed Cognition: No apparent impairments                               Following commands: Intact       Cueing  General Comments          Exercises     Shoulder Instructions      Home Living Family/patient expects to be discharged to:: Private residence Living Arrangements: Spouse/significant other Available Help at Discharge: Family Type of Home: House Home Access: Stairs to enter Secretary/administrator of Steps: 2 Entrance Stairs-Rails: Right Home Layout: One level     Bathroom Shower/Tub: Producer, television/film/video: Standard         Additional Comments: Pt unsure if he still has a cane, plans to check with spouse prior to d/c.      Prior Functioning/Environment Prior Level of Function : Independent/Modified Independent;Driving;Working/employed               ADLs Comments: works in a Teacher, adult education, drives    OT Problem List:     OT Treatment/Interventions:        OT Goals(Current goals can be found in the care plan section)   Acute Rehab OT Goals Patient Stated Goal: home OT Goal Formulation: With patient Time For Goal Achievement: 11/23/23 Potential to Achieve Goals: Good ADL Goals Pt Will Perform Lower Body Bathing: with modified independence;sit to/from stand Pt Will Perform Lower Body Dressing: with modified independence;sit to/from stand Pt Will Transfer to Toilet: with modified independence;ambulating Pt Will Perform Toileting - Clothing Manipulation and hygiene: with modified independence;sit to/from stand   OT Frequency:       Co-evaluation              AM-PAC OT "6 Clicks" Daily Activity     Outcome Measure Help from another person eating meals?: None Help from another person taking care of personal grooming?: None Help from another person toileting, which includes using toliet, bedpan, or urinal?: A Little Help from another person bathing (including washing, rinsing, drying)?: A  Little Help from another person to put on and taking off regular upper body clothing?: None Help from another person to put on and taking off regular lower body clothing?: A Little 6 Click Score: 21   End of Session    Activity Tolerance: Patient tolerated treatment well Patient left: in chair;with call bell/phone within reach;Other (comment) (eating lunch)  OT Visit Diagnosis: Unsteadiness on feet (R26.81)                Time: 4098-1191 OT Time Calculation (min): 15 min Charges:  OT General Charges $OT Visit: 1 Visit OT Evaluation $OT Eval Low Complexity: 1 Low  Lael Pierce, OT Acute Rehabilitation Services Office: [phone]   Brinton Canavan 11/09/2023, 12:18 PM

## 2023-11-09 NOTE — Progress Notes (Signed)
  Subjective:  Patient ID: Kyle [name], male    DOB: 1960-10-31,  MRN: [id number]  POD #1 left third and right 2nd and 3rd toe amputations.  Feeling well not having any pain.   Negative for chest pain and shortness of breath Fever: no Night sweats: no Review of all other systems is negative Objective:   Vitals:   11/09/23 0433 11/09/23 0837  BP: (!) 170/95 (!) 184/89  Pulse: 73 79  Resp: 18 19  Temp: 98.1 F (36.7 C) 98.6 F (37 C)  SpO2: 98% 94%   General AA&O x3. Normal mood and affect.  Vascular Dorsalis pedis and posterior tibial pulses 2/4 bilat. Brisk capillary refill to all digits. Pedal hair present.  Neurologic Epicritic sensation grossly absent.  Dermatologic Clean dry and intact dressings  Orthopedic: MMT 5/5 in dorsiflexion, plantarflexion, inversion, and eversion. Normal joint ROM without pain or crepitus.    Assessment & Plan:  Patient was evaluated and treated and all questions answered.  Status post left third and right 2nd and 3rd toe amputations - WBAT in surgical shoe bilateral.  PT will see today - Can de-escalate antibiotics to p.o.  Has childhood penicillin allergy with rash.  Has been tolerating cefepime  without rash.  Okay to DC on Keflex .  We will change as needed as outpatient - Office will arrange follow-up in 1 to 2 weeks with our practice.  They will call him on Monday. - Does not need dressing changes at home.  I discussed with him if there is any bleedthrough or strikethrough or if they get wet for any reason they should be changed with Betadine to the incisions and dry gauze and Ace wrap.  Otherwise leave intact until follow-up  Floyce Hutching, DPM  Accessible via secure chat for questions or concerns.

## 2023-11-09 NOTE — Evaluation (Signed)
 Physical Therapy Evaluation Patient Details Name: Kyle [name] MRN: [id number] DOB: 12/26/1960 Today's Date: 11/09/2023  History of Present Illness  63 year old male with type 2 DM, hyperlipidemia, GERD, HTN was sent from podiatry clinic with bilateral feet wounds after wearing new pair of boots for over the last 2 weeks. Found to have Osteomyelitis. Pt is now s/p  left third and right 2nd and 3rd toe amputations.  Clinical Impression  Pt presents to PT mobilizing very well s/p bilateral toe amputations. Pt ambulates without physical assistance and demonstrates fair stability despite reduced sensation in both feet, chronic related to peripheral neuropathy. PT provides education on the need for daily skin checks of his feet to monitor for wounds due to poor sensation. Pt is able to complete all mobility necessary in the home setting. No further PT needs identified at this time. PT signing off.        If plan is discharge home, recommend the following: A little help with bathing/dressing/bathroom;Assistance with cooking/housework;Assist for transportation   Can travel by private vehicle        Equipment Recommendations None recommended by PT  Recommendations for Other Services       Functional Status Assessment Patient has had a recent decline in their functional status and demonstrates the ability to make significant improvements in function in a reasonable and predictable amount of time.     Precautions / Restrictions Precautions Precautions: Fall Recall of Precautions/Restrictions: Intact Required Braces or Orthoses: Other Brace Other Brace: bilateral surgical shoes Restrictions Weight Bearing Restrictions Per Provider Order: Yes RLE Weight Bearing Per Provider Order: Weight bearing as tolerated LLE Weight Bearing Per Provider Order: Weight bearing as tolerated Other Position/Activity Restrictions: bilateral surgical shoes      Mobility  Bed Mobility                     Transfers Overall transfer level: Independent Equipment used: None                    Ambulation/Gait Ambulation/Gait assistance: Modified independent (Device/Increase time) Gait Distance (Feet): 150 Feet Assistive device: None Gait Pattern/deviations: Wide base of support Gait velocity: functional Gait velocity interpretation: 1.31 - 2.62 ft/sec, indicative of limited community ambulator   General Gait Details: slowed step-through gait with widened BOS, no significant balance deviations noted  Stairs Stairs: Yes Stairs assistance: Modified independent (Device/Increase time) Stair Management: One rail Left, Step to pattern, Forwards Number of Stairs: 2    Wheelchair Mobility     Tilt Bed    Modified Rankin (Stroke Patients Only)       Balance Overall balance assessment: Needs assistance Sitting-balance support: No upper extremity supported, Feet supported Sitting balance-Leahy Scale: Good     Standing balance support: No upper extremity supported, During functional activity Standing balance-Leahy Scale: Good                               Pertinent Vitals/Pain Pain Assessment Pain Assessment: No/denies pain    Home Living Family/patient expects to be discharged to:: Private residence Living Arrangements: Spouse/significant other Available Help at Discharge: Family Type of Home: House Home Access: Stairs to enter Entrance Stairs-Rails: Left Entrance Stairs-Number of Steps: 2   Home Layout: One level Home Equipment: Cane - single point;Wheelchair - manual Additional Comments: Pt unsure if he still has a cane, plans to check with spouse prior to d/c.    Prior Function  Prior Level of Function : Independent/Modified Independent;Driving;Working/employed               ADLs Comments: works in a Teacher, adult education, drives     Extremity/Trunk Assessment   Upper Extremity Assessment Upper Extremity Assessment: Overall WFL  for tasks assessed    Lower Extremity Assessment Lower Extremity Assessment: RLE deficits/detail;LLE deficits/detail RLE Sensation: history of peripheral neuropathy LLE Sensation: history of peripheral neuropathy    Cervical / Trunk Assessment Cervical / Trunk Assessment: Normal  Communication   Communication Communication: No apparent difficulties    Cognition Arousal: Alert Behavior During Therapy: WFL for tasks assessed/performed   PT - Cognitive impairments: No apparent impairments                         Following commands: Intact       Cueing Cueing Techniques: Verbal cues     General Comments General comments (skin integrity, edema, etc.): VSS on RA    Exercises     Assessment/Plan    PT Assessment Patient does not need any further PT services  PT Problem List         PT Treatment Interventions      PT Goals (Current goals can be found in the Care Plan section)       Frequency       Co-evaluation               AM-PAC PT "6 Clicks" Mobility  Outcome Measure Help needed turning from your back to your side while in a flat bed without using bedrails?: None Help needed moving from lying on your back to sitting on the side of a flat bed without using bedrails?: None Help needed moving to and from a bed to a chair (including a wheelchair)?: None Help needed standing up from a chair using your arms (e.g., wheelchair or bedside chair)?: None Help needed to walk in hospital room?: None Help needed climbing 3-5 steps with a railing? : None 6 Click Score: 24    End of Session   Activity Tolerance: Patient tolerated treatment well Patient left: in chair;with call bell/phone within reach Nurse Communication: Mobility status PT Visit Diagnosis: Other abnormalities of gait and mobility (R26.89)    Time: 1300-1308 PT Time Calculation (min) (ACUTE ONLY): 8 min   Charges:   PT Evaluation $PT Eval Low Complexity: 1 Low   PT General  Charges $$ ACUTE PT VISIT: 1 Visit         Rexie Catena, PT, DPT Acute Rehabilitation Office [phone]   Rexie Catena 11/09/2023, 1:22 PM

## 2023-11-09 NOTE — Discharge Summary (Signed)
 Physician Discharge Summary  MAZIAH KEELING YQM:[id number] DOB: 1960-08-17 DOA: 11/07/2023  PCP: Patient, No Pcp Per  Admit date: 11/07/2023 Discharge date: 11/09/2023  Admitted From: Home  Discharge disposition: home   Recommendations for Outpatient Follow-Up:   Follow up with your primary care provider in one week.  Check CBC, BMP, magnesium  in the next visit Follow-up with podiatry in 1 to 2 weeks office to schedule an appointment.   Discharge Diagnosis:   Principal Problem:   Osteomyelitis of right foot (HCC) Active Problems:   DM (diabetes mellitus) type II controlled, neurological manifestation (HCC)   Essential hypertension   Acute osteomyelitis of left ankle or foot (HCC)   Osteomyelitis of third toe of right foot (HCC)   Osteomyelitis of second toe of left foot (HCC)   Osteomyelitis of third toe of left foot (HCC)    Discharge Condition: Improved.  Diet recommendation: Low sodium, heart healthy.  Carbohydrate-modified.    Wound care: keep the dressing on till next visit  Code status: Full.   History of Present Illness:   63 year old male with type 2 diabetes mellitus, hyperlipidemia, GERD, hypertension was sent from podiatry clinic with bilateral feet wounds after wearing new pair of boots for over the last 2 weeks.  Patient had noticed some sores on his third digit of his left foot and 2nd and 3rd digit of his right foot and went to urgent care and was put on doxycycline .  Despite this his wounds continue to get worse so went to see podiatrist who referred the patient back to the ED, patient denied any shortness of breath, cough, chest pain, fever, chills or rigor.  Denies any dizziness, lightheadedness or syncope.  Denies any urinary urgency, frequency or dysuria.  Denies any changes in his bowel habits.  No sick contacts or recent travel.   MRI of the feet bilaterally showed osteomyelitis of the left third digit with cortical disruption and findings  concerning for osteomyelitis of the second left proximal and middle phalanges, osteomyelitis of the third toe.  MRI of the right toe showed osteomyelitis of the right third digit. Blood cultures x 2 were collected and the patient was started on IV vancomycin , Rocephin , Flagyl  in the ED, podiatry was notified and was considered for admission to the hospital for further evaluation and treatment.   Hospital Course:   Following conditions were addressed during hospitalization as listed below,  Osteomyelitis of the left second and third digit and right third digit of the foot.   Podiatry on board and patient has undergone amputation of the affected toes currently on Flagyl , cefepime  and vancomycin ..  No leukocytosis.    De-escalate antibiotic to keflex  on discharge.  Blood cultures negative in 1 day.  Patient is afebrile.  Patient has been seen by podiatry today and okay for disposition with outpatient follow-up.  Patient can weight-bear as tolerated and will be seen at the outpatient podiatry clinic   Diabetes mellitus type 2 with mild hyperglycemia..   Latest hemoglobin A1c of 5.4.  Will resume home insulin  regimen on discharge.   GERD Not on medications at home   Hypertension. Continue amlodipine , metoprolol  and Benicar on discharge.  Class II obesity.Body mass index is 39.7 kg/m.  Patient would benefit from lifestyle modifications and weight loss as outpatient.  Disposition.  At this time, patient is stable for disposition home with outpatient PCP and podiatry follow-up  Medical Consultants:   Podiatry  Procedures:    Amputation of the left third and  right 2nd and 3rd toe by podiatry on 11/08/2023 Subjective:   Today, patient was seen and examined at bedside.  Patient denies any pain, nausea, vomiting, fever, chills or rigor.  Ambulated with physical therapy and no skilled therapy needs were identified  Discharge Exam:   Vitals:   11/09/23 0433 11/09/23 0837  BP: (!) 170/95 (!)  184/89  Pulse: 73 79  Resp: 18 19  Temp: 98.1 F (36.7 C) 98.6 F (37 C)  SpO2: 98% 94%   Vitals:   11/08/23 1254 11/08/23 1953 11/09/23 0433 11/09/23 0837  BP: (!) 163/93 (!) 151/88 (!) 170/95 (!) 184/89  Pulse:  75 73 79  Resp:   18 19  Temp:  97.7 F (36.5 C) 98.1 F (36.7 C) 98.6 F (37 C)  TempSrc:  Oral Oral   SpO2: 95% 93% 98% 94%  Weight:   125.5 kg   Height:       Body mass index is 39.7 kg/m.   GENERAL: Patient is alert awake and oriented. Not in obvious distress.  Obese build. HENT: No scleral pallor or icterus. Pupils equally reactive to light. Oral mucosa is moist NECK: is supple, no gross swelling noted. CHEST: Clear to auscultation. No crackles or wheezes.  Diminished breath sounds bilaterally. CVS: S1 and S2 heard, no murmur. Regular rate and rhythm.  ABDOMEN: Soft, non-tender, bowel sounds are present. EXTREMITIES: No edema. CNS: Cranial nerves are intact. No focal motor deficits. SKIN: warm and dry, bilateral foot with dressing.    The results of significant diagnostics from this hospitalization (including imaging, microbiology, ancillary and laboratory) are listed below for reference.     Diagnostic Studies:   DG Foot 2 Views Left Result Date: 11/08/2023 CLINICAL DATA:  Postoperative assessment for amputation EXAM: LEFT FOOT - 2 VIEW COMPARISON:  11/07/2023 FINDINGS: Interval amputations of the second and third digits at the MTP joint level. No complicating feature. Chronic deformity of the first metatarsal likely from a healed fracture. Plantar calcaneal spur. Dorsal midfoot spurring. Regional atheromatous vascular calcification. IMPRESSION: 1. Interval amputations of the second and third digits at the MTP joint level. No complicating feature. 2. Chronic deformity of the first metatarsal likely from a healed fracture. 3. Plantar calcaneal spur. 4. Regional atheromatous vascular calcification. Electronically Signed   By: Freida Jes M.D.   On:  11/08/2023 16:56   DG Foot 2 Views Right Result Date: 11/08/2023 CLINICAL DATA:  Postop EXAM: RIGHT FOOT - 2 VIEW COMPARISON:  11/07/2023 MRI 11/07/2023 FINDINGS: Status post amputation of the third digit at the level of the MTP joint. Vascular calcifications. Fixating screws within the distal tibia and fibula. IMPRESSION: Status post amputation of the third digit at the level of the MTP joint. Electronically Signed   By: Esmeralda Hedge M.D.   On: 11/08/2023 16:51   MR TOES LEFT WO CONTRAST Result Date: 11/07/2023 CLINICAL DATA:  Concern for osteomyelitis. EXAM: MRI OF THE LEFT TOES WITHOUT CONTRAST TECHNIQUE: Multiplanar, multisequence MR imaging of the left foot was performed. No intravenous contrast was administered. COMPARISON:  Left foot radiographs dated 11/07/2023. FINDINGS: Bones/Joint/Cartilage Marrow signal abnormality of the third digit, compatible with osteomyelitis, with cortical disruption of the head of the third proximal phalanx and medial subluxation of third middle phalanx. Marrow signal abnormality of the second proximal and middle phalanges with mild corresponding T1 hypointensity is also concerning for osteomyelitis. No additional marrow signal abnormality identified elsewhere to suggest osteomyelitis. Remote healed fracture of the mid shaft of the first metatarsal  surrounding chronic bridging callus. Mild-to-moderate degenerative changes of the TMT joints, most pronounced at the second and third TMT joints. Mild-to-moderate degenerative changes of the first MTP joint. Ligaments Collateral ligaments are intact.  Lisfranc ligament is intact. Muscles and Tendons There is atrophy and edema of the intrinsic musculature of the foot. No tenosynovitis. Soft tissue Soft tissue ulceration/wound along the dorsal aspect of the second and third digits. No loculated fluid collection. Subcutaneous edema extending along the dorsal foot. IMPRESSION: 1. Osteomyelitis of the left third digit with cortical  disruption at the head of the third proximal phalanx and medial subluxation of the third middle phalanx. 2. Findings concerning for osteomyelitis of the left second proximal and middle phalanges. 3. Soft tissue ulceration/wound along the dorsal aspect of the second and third digits. No loculated fluid collection. 4. Nonspecific subcutaneous edema extending along the dorsal foot, could reflect cellulitis. 5. Atrophy and edema of the intrinsic musculature of the foot. These findings may be secondary to chronic denervation changes, however, myositis can not be excluded. 6. Mild-to-moderate degenerative changes of the midfoot and forefoot. Electronically Signed   By: Mannie Seek M.D.   On: 11/07/2023 20:47   MR TOES RIGHT WO CONTRAST Result Date: 11/07/2023 CLINICAL DATA:  Concern for osteomyelitis. EXAM: MRI OF THE RIGHT TOES WITHOUT CONTRAST TECHNIQUE: Multiplanar, multisequence MR imaging of the right foot was performed. No intravenous contrast was administered. COMPARISON:  Right foot radiographs dated 11/07/2023. FINDINGS: Bones/Joint/Cartilage Soft tissue wound along the dorsal aspect of the right third digit. Marrow signal abnormality of the third digit, most pronounced involving the third proximal and middle phalanges, compatible with osteomyelitis. No marrow signal abnormality identified elsewhere to suggest osteomyelitis. Mild-to-moderate degenerative changes of the midfoot and forefoot. Trace fifth MTP joint effusion. Ligaments Collateral ligaments are intact.  Lisfranc ligament is intact. Muscles and Tendons Mild atrophy and edema of the intrinsic musculature of the foot. Soft tissue No loculated fluid collection. Diffuse subcutaneous edema of the foot, most pronounced dorsally. Bursitis of the second intermetatarsal space IMPRESSION: 1. Findings compatible with osteomyelitis of the right third digit. 2. Diffuse nonspecific subcutaneous edema of the foot, most pronounced dorsally, could reflect  cellulitis. No loculated fluid collection. 3. Mild atrophy and edema of the intrinsic musculature of the foot. These findings may be secondary to chronic denervation changes, however, myositis can not be excluded. 4. Second intermetatarsal space bursitis. 5. Mild-to-moderate degenerative changes of the midfoot and forefoot. Electronically Signed   By: Mannie Seek M.D.   On: 11/07/2023 20:33   DG Foot Complete Left Result Date: 11/07/2023 CLINICAL DATA:  Toe infection not improving, wounds on the bilateral feet. EXAM: LEFT FOOT - COMPLETE 3+ VIEW COMPARISON:  Radiograph 11/04/2023 FINDINGS: Cortical disruption and irregularity about the head of the third toe proximal phalanx has increased compared to 11/04/2023. Question impaction of the proximal phalanx into the middle phalanx. Medial subluxation of the middle phalanx of the third toe also increased. Soft tissue swelling about the foot and third toe. IMPRESSION: Findings are concerning for septic arthritis/osteomyelitis at the third PIP joint. Question superimposed pathologic fracture. Electronically Signed   By: Rozell Cornet M.D.   On: 11/07/2023 17:28   DG Foot Complete Right Result Date: 11/07/2023 CLINICAL DATA:  toe infection, not improving. EXAM: RIGHT FOOT COMPLETE - 3+ VIEW COMPARISON:  11/04/2023. FINDINGS: No acute fracture or dislocation. No aggressive osseous lesion. Redemonstration of threaded metallic cortical screws overlying the medial and lateral malleoli, similar to the prior study. Mild-to-moderate diffuse  degenerative changes of imaged joints. Calcaneal spur noted along the Achilles tendon and Plantar aponeurosis attachment sites. Mild diffuse soft tissue swelling over the dorsum of the forefoot. No radiopaque foreign bodies. IMPRESSION: No acute osseous abnormality of the right foot. Electronically Signed   By: Beula Brunswick M.D.   On: 11/07/2023 15:34     Labs:   Basic Metabolic Panel: Recent Labs  Lab 11/07/23 1446  11/08/23 0519 11/09/23 0539  NA 141 141 141  K 4.5 3.8 4.1  CL 108 105 105  CO2 22 24 24   GLUCOSE 118* 103* 174*  BUN 14 16 15   CREATININE 0.84 0.88 0.76  CALCIUM  9.5 9.7 9.4  MG  --  1.9 1.9  PHOS  --   --  2.8   GFR Estimated Creatinine Clearance: 130.6 mL/min (by C-G formula based on SCr of 0.76 mg/dL). Liver Function Tests: Recent Labs  Lab 11/08/23 0519 11/09/23 0539  AST 21 14*  ALT 18 15  ALKPHOS 64 59  BILITOT 0.6 0.4  PROT 7.1 6.2*  ALBUMIN 3.1* 2.8*   No results for input(s): "LIPASE", "AMYLASE" in the last 168 hours. No results for input(s): "AMMONIA" in the last 168 hours. Coagulation profile Recent Labs  Lab 11/08/23 0545  INR 1.1    CBC: Recent Labs  Lab 11/07/23 1446 11/08/23 0519 11/09/23 0539  WBC 8.2 6.1 11.4*  NEUTROABS 5.8 3.6  --   HGB 15.9 17.0 15.6  HCT 47.7 50.6 46.5  MCV 93.5 95.5 92.6  PLT 293 270 280   Cardiac Enzymes: No results for input(s): "CKTOTAL", "CKMB", "CKMBINDEX", "TROPONINI" in the last 168 hours. BNP: Invalid input(s): "POCBNP" CBG: Recent Labs  Lab 11/08/23 2322 11/09/23 0108 11/09/23 0546 11/09/23 0832 11/09/23 1105  GLUCAP 208* 188* 196* 210* 150*   D-Dimer No results for input(s): "DDIMER" in the last 72 hours. Hgb A1c Recent Labs    11/08/23 0519  HGBA1C 5.4   Lipid Profile No results for input(s): "CHOL", "HDL", "LDLCALC", "TRIG", "CHOLHDL", "LDLDIRECT" in the last 72 hours. Thyroid function studies No results for input(s): "TSH", "T4TOTAL", "T3FREE", "THYROIDAB" in the last 72 hours.  Invalid input(s): "FREET3" Anemia work up No results for input(s): "VITAMINB12", "FOLATE", "FERRITIN", "TIBC", "IRON", "RETICCTPCT" in the last 72 hours. Microbiology Recent Results (from the past 240 hours)  Blood Cultures x 2 sites     Status: None (Preliminary result)   Collection Time: 11/08/23  3:26 AM   Specimen: BLOOD  Result Value Ref Range Status   Specimen Description BLOOD BLOOD RIGHT ARM   Final   Special Requests   Final    BOTTLES DRAWN AEROBIC AND ANAEROBIC Blood Culture adequate volume   Culture   Final    NO GROWTH 1 DAY Performed at [facility], [address] [place], [place] [address]    Report Status PENDING  Incomplete  Blood Cultures x 2 sites     Status: None (Preliminary result)   Collection Time: 11/08/23  3:53 AM   Specimen: BLOOD  Result Value Ref Range Status   Specimen Description BLOOD BLOOD LEFT ARM  Final   Special Requests   Final    BOTTLES DRAWN AEROBIC AND ANAEROBIC Blood Culture adequate volume   Culture   Final    NO GROWTH 1 DAY Performed at [facility], [address] [place], [place] [address]    Report Status PENDING  Incomplete  Surgical pcr screen     Status: None  Collection Time: 11/08/23 11:04 AM   Specimen: Nasal Mucosa; Nasal Swab  Result Value Ref Range Status   MRSA, PCR NEGATIVE NEGATIVE Final   Staphylococcus aureus NEGATIVE NEGATIVE Final    Comment: (NOTE) The Xpert SA Assay (FDA approved for NASAL specimens in patients 51 years of age and older), is one component of a comprehensive surveillance program. It is not intended to diagnose infection nor to guide or monitor treatment. Performed at [facility], [address], [place], [place] [address]      Discharge Instructions:   Discharge Instructions     Call MD for:  persistant nausea and vomiting   Complete by: As directed    Call MD for:  redness, tenderness, or signs of infection (pain, swelling, redness, odor or green/yellow discharge around incision site)   Complete by: As directed    Call MD for:  severe uncontrolled pain   Complete by: As directed    Call MD for:  temperature >100.4   Complete by: As directed    Diet Carb Modified   Complete by: As directed    Discharge instructions   Complete by: As directed    Follow up with your primary care provider in 1 week.  Check blood work in the next visit.  Follow-up with podiatry  as scheduled by the clinic in 1 to 2 weeks.  Seek medical attention for worsening symptoms.   Increase activity slowly   Complete by: As directed       Allergies as of 11/09/2023       Reactions   Acetazolamide    Other reaction(s): Confusion   Hydrocodone    Other reaction(s): Hallucinations   Penicillins Rash   Has patient had a PCN reaction causing immediate rash, facial/tongue/throat swelling, SOB or lightheadedness with hypotension: YES Has patient had a PCN reaction causing severe rash involving mucus membranes or skin necrosis: NO Has patient had a PCN reaction that required hospitalization: NO Has patient had a PCN reaction occurring within the last 10 years: NO If all of the above answers are "NO", then may proceed with Cephalosporin use.   Oxycodone Other (See Comments)   Hallucinations        Medication List     STOP taking these medications    doxycycline  100 MG capsule Commonly known as: VIBRAMYCIN        TAKE these medications    amLODipine  10 MG tablet Commonly known as: NORVASC  Take 10 mg by mouth daily.   brimonidine  0.2 % ophthalmic solution Commonly known as: ALPHAGAN  Place 1 drop into the right eye in the morning and at bedtime.   cephALEXin  500 MG capsule Commonly known as: KEFLEX  Take 1 capsule (500 mg total) by mouth 4 (four) times daily for 10 days.   cholecalciferol  25 MCG (1000 UNIT) tablet Commonly known as: VITAMIN D3 Take 1,000 Units by mouth daily.   cyanocobalamin  500 MCG tablet Commonly known as: VITAMIN B12 Take 500 mcg by mouth daily.   dorzolamide -timolol  2-0.5 % ophthalmic solution Commonly known as: COSOPT  Place 1 drop into the right eye 2 (two) times daily.   gabapentin  300 MG capsule Commonly known as: Neurontin  4 tablets twice a day, for 1 week, then decrease to 3 tablets twice a day for 1 week What changed:  how much to take how to take this when to take this additional instructions   Magnesium  250 MG  Tabs Take 250 mg by mouth daily.   metoprolol  tartrate 50 MG  tablet Commonly known as: LOPRESSOR  Take 50 mg by mouth daily.   Mounjaro 12.5 MG/0.5ML Pen Generic drug: tirzepatide SMARTSIG:12.5 Milligram(s) SUB-Q Once a Week   olmesartan 40 MG tablet Commonly known as: BENICAR Take 40 mg by mouth daily.   OVER THE COUNTER MEDICATION Take 1 capsule by mouth daily. Physician's Choice 40 Billion Probiotic   RA Krill Oil 500 MG Caps Take 500 mg by mouth daily.   simvastatin  40 MG tablet Commonly known as: ZOCOR  Take 40 mg by mouth daily at 6 PM.   Synjardy XR 12.11-998 MG Tb24 Generic drug: Empagliflozin-metFORMIN HCl ER Take 1 tablet by mouth 2 (two) times daily.   Toujeo  Max SoloStar 300 UNIT/ML Solostar Pen Generic drug: insulin  glargine (2 Unit Dial) Inject 50 Units into the skin daily.        Follow-up Information     primary care provider Follow up in 1 week(s).                   Time coordinating discharge: 39 minutes  Signed:  Rody Keadle  [facility] 11/09/2023, 1:42 PM

## 2023-11-09 NOTE — Progress Notes (Signed)
 PROGRESS NOTE  Kyle [name] YQM:[id number] DOB: 07-17-1961 DOA: 11/07/2023 PCP: Patient, No Pcp Per   LOS: 1 day   Brief narrative:  63 year old male with type 2 diabetes mellitus, hyperlipidemia, GERD, hypertension was sent from podiatry clinic with bilateral feet wounds after wearing new pair of boots for over the last 2 weeks.  Patient had noticed some sores on his third digit of his left foot and 2nd and 3rd digit of his right foot and went to urgent care and was put on doxycycline .  Despite this his wounds continue to get worse so went to see podiatrist who referred the patient back to the ED, patient denied any shortness of breath, cough, chest pain, fever, chills or rigor.  Denies any dizziness, lightheadedness or syncope.  Denies any urinary urgency, frequency or dysuria.  Denies any changes in his bowel habits.  No sick contacts or recent travel.   MRI of the feet bilaterally showed osteomyelitis of the left third digit with cortical disruption and findings concerning for osteomyelitis of the second left proximal and middle phalanges, osteomyelitis of the third toe.  MRI of the right toe showed osteomyelitis of the right third digit. Blood cultures x 2 were collected and the patient was started on IV vancomycin , Rocephin , Flagyl  in the ED and was considered for admission to the hospital. Dr. Rosemarie Conquest of podiatry was consulted.     Assessment/Plan: Principal Problem:   Osteomyelitis of right foot (HCC) Active Problems:   DM (diabetes mellitus) type II controlled, neurological manifestation (HCC)   Essential hypertension   Acute osteomyelitis of left ankle or foot (HCC)   Osteomyelitis of third toe of right foot (HCC)   Osteomyelitis of second toe of left foot (HCC)   Osteomyelitis of third toe of left foot (HCC)  Osteomyelitis of the left second and third digit and right third digit of the foot.   Podiatry on board and patient has undergone amputation of the affected toes  currently on Flagyl , cefepime  and vancomycin ..  No leukocytosis.  Will follow podiatry recommendation.  Will get PT OT evaluation.  De-escalate antibiotic when okay with podiatry.  Follow culture and sensitivity.  Blood cultures negative in 1 day.  Patient is afebrile.  Blood pressure is elevated.   Diabetes mellitus type 2 with mild hyperglycemia.. Continue sliding scale insulin , Accu-Cheks, diabetic diet.  Latest hemoglobin A1c of 5.4.  Adjust insulin  regimen as needed   GERD Continue Protonix   Hypertension. Continue amlodipine , metoprolol  and Benicar   DVT prophylaxis: SCDs Start: 11/08/23 0429   Disposition: Likely home in 1 to 2 days when okay with podiatry.  Status is: Inpatient  Remains inpatient appropriate because: Status post amputation, need for PT OT evaluation, IV antibiotic, follow culture and sensitivity.    Code Status:     Code Status: Full Code  Family Communication: None at bedside  Consultants: Podiatry  Procedures: Amputation of the third toe of the right foot, amputation of 2nd and 3rd toe left foot.  Anti-infectives:  Vancomycin  metronidazole  and cefepime   Anti-infectives (From admission, onward)    Start     Dose/Rate Route Frequency Ordered Stop   11/09/23 0600  vancomycin  (VANCOCIN ) 1,250 mg in sodium chloride  0.9 % 250 mL IVPB  Status:  Discontinued        1,250 mg 175 mL/hr over 90 Minutes Intravenous Every 12 hours 11/08/23 1802 11/08/23 1934   11/09/23 0600  vancomycin  (VANCOCIN ) 1,250 mg in sodium chloride  0.9 % 250 mL IVPB  1,250 mg 175 mL/hr over 90 Minutes Intravenous Every 12 hours 11/08/23 1934     11/08/23 1800  vancomycin  (VANCOREADY) IVPB 1250 mg/250 mL  Status:  Discontinued        1,250 mg 166.7 mL/hr over 90 Minutes Intravenous Every 12 hours 11/08/23 1737 11/08/23 1759   11/08/23 1800  Vancomycin  (VANCOCIN ) 1,250 mg in sodium chloride  0.9 % 250 mL IVPB        1,250 mg 166.7 mL/hr over 90 Minutes Intravenous  Once  11/08/23 1800 11/08/23 1956   11/08/23 1700  vancomycin  (VANCOREADY) IVPB 1250 mg/250 mL  Status:  Discontinued        1,250 mg 166.7 mL/hr over 90 Minutes Intravenous Every 12 hours 11/08/23 0736 11/08/23 1737   11/08/23 1045  metroNIDAZOLE  (FLAGYL ) IVPB 500 mg        500 mg 100 mL/hr over 60 Minutes Intravenous Every 12 hours 11/08/23 0948     11/08/23 0500  ceFEPIme  (MAXIPIME ) 2 g in sodium chloride  0.9 % 100 mL IVPB        2 g 200 mL/hr over 30 Minutes Intravenous Every 8 hours 11/08/23 0439     11/08/23 0300  vancomycin  (VANCOREADY) IVPB 2000 mg/400 mL        2,000 mg 200 mL/hr over 120 Minutes Intravenous  Once 11/08/23 0243 11/08/23 0806   11/08/23 0245  cefTRIAXone  (ROCEPHIN ) 2 g in sodium chloride  0.9 % 100 mL IVPB       Placed in "And" Linked Group   2 g 200 mL/hr over 30 Minutes Intravenous Once 11/08/23 0240 11/08/23 0414   11/08/23 0245  metroNIDAZOLE  (FLAGYL ) IVPB 500 mg       Placed in "And" Linked Group   500 mg 100 mL/hr over 60 Minutes Intravenous  Once 11/08/23 0240 11/08/23 0517   11/08/23 0245  vancomycin  (VANCOCIN ) IVPB 1000 mg/200 mL premix  Status:  Discontinued       Placed in "And" Linked Group   1,000 mg 200 mL/hr over 60 Minutes Intravenous  Once 11/08/23 0240 11/08/23 0242        Subjective: Today, patient was seen and examined at bedside.  Patient denies pain, nausea, vomiting, fever, chills or rigor.  Objective: Vitals:   11/09/23 0433 11/09/23 0837  BP: (!) 170/95 (!) 184/89  Pulse: 73 79  Resp: 18 19  Temp: 98.1 F (36.7 C) 98.6 F (37 C)  SpO2: 98% 94%    Intake/Output Summary (Last 24 hours) at 11/09/2023 0901 Last data filed at 11/09/2023 0821 Gross per 24 hour  Intake 940 ml  Output 1450 ml  Net -510 ml   Filed Weights   11/08/23 1026 11/09/23 0433  Weight: 120.2 kg 125.5 kg   Body mass index is 39.7 kg/m.   Physical Exam:  GENERAL: Patient is alert awake and oriented. Not in obvious distress.  Obese build. HENT: No  scleral pallor or icterus. Pupils equally reactive to light. Oral mucosa is moist NECK: is supple, no gross swelling noted. CHEST: Clear to auscultation. No crackles or wheezes.  Diminished breath sounds bilaterally. CVS: S1 and S2 heard, no murmur. Regular rate and rhythm.  ABDOMEN: Soft, non-tender, bowel sounds are present. EXTREMITIES: No edema. CNS: Cranial nerves are intact. No focal motor deficits. SKIN: warm and dry, bilateral foot with dressing.  Data Review: I have personally reviewed the following laboratory data and studies,  CBC: Recent Labs  Lab 11/07/23 1446 11/08/23 0519 11/09/23 0539  WBC 8.2 6.1 11.4*  NEUTROABS  5.8 3.6  --   HGB 15.9 17.0 15.6  HCT 47.7 50.6 46.5  MCV 93.5 95.5 92.6  PLT 293 270 280   Basic Metabolic Panel: Recent Labs  Lab 11/07/23 1446 11/08/23 0519 11/09/23 0539  NA 141 141 141  K 4.5 3.8 4.1  CL 108 105 105  CO2 22 24 24   GLUCOSE 118* 103* 174*  BUN 14 16 15   CREATININE 0.84 0.88 0.76  CALCIUM  9.5 9.7 9.4  MG  --  1.9 1.9  PHOS  --   --  2.8   Liver Function Tests: Recent Labs  Lab 11/08/23 0519 11/09/23 0539  AST 21 14*  ALT 18 15  ALKPHOS 64 59  BILITOT 0.6 0.4  PROT 7.1 6.2*  ALBUMIN 3.1* 2.8*   No results for input(s): "LIPASE", "AMYLASE" in the last 168 hours. No results for input(s): "AMMONIA" in the last 168 hours. Cardiac Enzymes: No results for input(s): "CKTOTAL", "CKMB", "CKMBINDEX", "TROPONINI" in the last 168 hours. BNP (last 3 results) No results for input(s): "BNP" in the last 8760 hours.  ProBNP (last 3 results) No results for input(s): "PROBNP" in the last 8760 hours.  CBG: Recent Labs  Lab 11/08/23 1838 11/08/23 2322 11/09/23 0108 11/09/23 0546 11/09/23 0832  GLUCAP 209* 208* 188* 196* 210*   Recent Results (from the past 240 hours)  Blood Cultures x 2 sites     Status: None (Preliminary result)   Collection Time: 11/08/23  3:26 AM   Specimen: BLOOD  Result Value Ref Range Status    Specimen Description BLOOD BLOOD RIGHT ARM  Final   Special Requests   Final    BOTTLES DRAWN AEROBIC AND ANAEROBIC Blood Culture adequate volume   Culture   Final    NO GROWTH < 12 HOURS Performed at [facility], [address] [place], [place] [address]    Report Status PENDING  Incomplete  Blood Cultures x 2 sites     Status: None (Preliminary result)   Collection Time: 11/08/23  3:53 AM   Specimen: BLOOD  Result Value Ref Range Status   Specimen Description BLOOD BLOOD LEFT ARM  Final   Special Requests   Final    BOTTLES DRAWN AEROBIC AND ANAEROBIC Blood Culture adequate volume   Culture   Final    NO GROWTH < 12 HOURS Performed at [facility], [address] [place], [place] [address]    Report Status PENDING  Incomplete  Surgical pcr screen     Status: None   Collection Time: 11/08/23 11:04 AM   Specimen: Nasal Mucosa; Nasal Swab  Result Value Ref Range Status   MRSA, PCR NEGATIVE NEGATIVE Final   Staphylococcus aureus NEGATIVE NEGATIVE Final    Comment: (NOTE) The Xpert SA Assay (FDA approved for NASAL specimens in patients 90 years of age and older), is one component of a comprehensive surveillance program. It is not intended to diagnose infection nor to guide or monitor treatment. Performed at [facility], [address], [place], [place] [address]      Studies: DG Foot 2 Views Left Result Date: 11/08/2023 CLINICAL DATA:  Postoperative assessment for amputation EXAM: LEFT FOOT - 2 VIEW COMPARISON:  11/07/2023 FINDINGS: Interval amputations of the second and third digits at the MTP joint level. No complicating feature. Chronic deformity of the first metatarsal likely from a healed fracture. Plantar calcaneal spur. Dorsal midfoot spurring. Regional atheromatous vascular calcification. IMPRESSION: 1. Interval amputations of the second  and third digits at the MTP joint level. No complicating feature. 2. Chronic deformity of the first metatarsal  likely from a healed fracture. 3. Plantar calcaneal spur. 4. Regional atheromatous vascular calcification. Electronically Signed   By: Freida Jes M.D.   On: 11/08/2023 16:56   DG Foot 2 Views Right Result Date: 11/08/2023 CLINICAL DATA:  Postop EXAM: RIGHT FOOT - 2 VIEW COMPARISON:  11/07/2023 MRI 11/07/2023 FINDINGS: Status post amputation of the third digit at the level of the MTP joint. Vascular calcifications. Fixating screws within the distal tibia and fibula. IMPRESSION: Status post amputation of the third digit at the level of the MTP joint. Electronically Signed   By: Esmeralda Hedge M.D.   On: 11/08/2023 16:51   MR TOES LEFT WO CONTRAST Result Date: 11/07/2023 CLINICAL DATA:  Concern for osteomyelitis. EXAM: MRI OF THE LEFT TOES WITHOUT CONTRAST TECHNIQUE: Multiplanar, multisequence MR imaging of the left foot was performed. No intravenous contrast was administered. COMPARISON:  Left foot radiographs dated 11/07/2023. FINDINGS: Bones/Joint/Cartilage Marrow signal abnormality of the third digit, compatible with osteomyelitis, with cortical disruption of the head of the third proximal phalanx and medial subluxation of third middle phalanx. Marrow signal abnormality of the second proximal and middle phalanges with mild corresponding T1 hypointensity is also concerning for osteomyelitis. No additional marrow signal abnormality identified elsewhere to suggest osteomyelitis. Remote healed fracture of the mid shaft of the first metatarsal surrounding chronic bridging callus. Mild-to-moderate degenerative changes of the TMT joints, most pronounced at the second and third TMT joints. Mild-to-moderate degenerative changes of the first MTP joint. Ligaments Collateral ligaments are intact.  Lisfranc ligament is intact. Muscles and Tendons There is atrophy and edema of the intrinsic musculature of the foot. No tenosynovitis. Soft tissue Soft tissue ulceration/wound along the dorsal aspect of the second and  third digits. No loculated fluid collection. Subcutaneous edema extending along the dorsal foot. IMPRESSION: 1. Osteomyelitis of the left third digit with cortical disruption at the head of the third proximal phalanx and medial subluxation of the third middle phalanx. 2. Findings concerning for osteomyelitis of the left second proximal and middle phalanges. 3. Soft tissue ulceration/wound along the dorsal aspect of the second and third digits. No loculated fluid collection. 4. Nonspecific subcutaneous edema extending along the dorsal foot, could reflect cellulitis. 5. Atrophy and edema of the intrinsic musculature of the foot. These findings may be secondary to chronic denervation changes, however, myositis can not be excluded. 6. Mild-to-moderate degenerative changes of the midfoot and forefoot. Electronically Signed   By: Mannie Seek M.D.   On: 11/07/2023 20:47   MR TOES RIGHT WO CONTRAST Result Date: 11/07/2023 CLINICAL DATA:  Concern for osteomyelitis. EXAM: MRI OF THE RIGHT TOES WITHOUT CONTRAST TECHNIQUE: Multiplanar, multisequence MR imaging of the right foot was performed. No intravenous contrast was administered. COMPARISON:  Right foot radiographs dated 11/07/2023. FINDINGS: Bones/Joint/Cartilage Soft tissue wound along the dorsal aspect of the right third digit. Marrow signal abnormality of the third digit, most pronounced involving the third proximal and middle phalanges, compatible with osteomyelitis. No marrow signal abnormality identified elsewhere to suggest osteomyelitis. Mild-to-moderate degenerative changes of the midfoot and forefoot. Trace fifth MTP joint effusion. Ligaments Collateral ligaments are intact.  Lisfranc ligament is intact. Muscles and Tendons Mild atrophy and edema of the intrinsic musculature of the foot. Soft tissue No loculated fluid collection. Diffuse subcutaneous edema of the foot, most pronounced dorsally. Bursitis of the second intermetatarsal space IMPRESSION: 1.  Findings compatible with osteomyelitis of  the right third digit. 2. Diffuse nonspecific subcutaneous edema of the foot, most pronounced dorsally, could reflect cellulitis. No loculated fluid collection. 3. Mild atrophy and edema of the intrinsic musculature of the foot. These findings may be secondary to chronic denervation changes, however, myositis can not be excluded. 4. Second intermetatarsal space bursitis. 5. Mild-to-moderate degenerative changes of the midfoot and forefoot. Electronically Signed   By: Mannie Seek M.D.   On: 11/07/2023 20:33   DG Foot Complete Left Result Date: 11/07/2023 CLINICAL DATA:  Toe infection not improving, wounds on the bilateral feet. EXAM: LEFT FOOT - COMPLETE 3+ VIEW COMPARISON:  Radiograph 11/04/2023 FINDINGS: Cortical disruption and irregularity about the head of the third toe proximal phalanx has increased compared to 11/04/2023. Question impaction of the proximal phalanx into the middle phalanx. Medial subluxation of the middle phalanx of the third toe also increased. Soft tissue swelling about the foot and third toe. IMPRESSION: Findings are concerning for septic arthritis/osteomyelitis at the third PIP joint. Question superimposed pathologic fracture. Electronically Signed   By: Rozell Cornet M.D.   On: 11/07/2023 17:28   DG Foot Complete Right Result Date: 11/07/2023 CLINICAL DATA:  toe infection, not improving. EXAM: RIGHT FOOT COMPLETE - 3+ VIEW COMPARISON:  11/04/2023. FINDINGS: No acute fracture or dislocation. No aggressive osseous lesion. Redemonstration of threaded metallic cortical screws overlying the medial and lateral malleoli, similar to the prior study. Mild-to-moderate diffuse degenerative changes of imaged joints. Calcaneal spur noted along the Achilles tendon and Plantar aponeurosis attachment sites. Mild diffuse soft tissue swelling over the dorsum of the forefoot. No radiopaque foreign bodies. IMPRESSION: No acute osseous abnormality of the  right foot. Electronically Signed   By: Beula Brunswick M.D.   On: 11/07/2023 15:34      Kyle Conradi, MD  [facility] 11/09/2023  If 7PM-7AM, please contact night-coverage

## 2023-11-11 LAB — SURGICAL PATHOLOGY

## 2023-11-12 ENCOUNTER — Encounter: Payer: Self-pay | Admitting: Podiatry

## 2023-11-13 LAB — CULTURE, BLOOD (ROUTINE X 2)
Culture: NO GROWTH
Culture: NO GROWTH
Special Requests: ADEQUATE
Special Requests: ADEQUATE

## 2023-11-14 ENCOUNTER — Ambulatory Visit (INDEPENDENT_AMBULATORY_CARE_PROVIDER_SITE_OTHER): Admitting: Podiatry

## 2023-11-14 ENCOUNTER — Encounter: Payer: Self-pay | Admitting: Podiatry

## 2023-11-14 DIAGNOSIS — L97514 Non-pressure chronic ulcer of other part of right foot with necrosis of bone: Secondary | ICD-10-CM | POA: Diagnosis not present

## 2023-11-14 DIAGNOSIS — E08621 Diabetes mellitus due to underlying condition with foot ulcer: Secondary | ICD-10-CM

## 2023-11-14 DIAGNOSIS — L97524 Non-pressure chronic ulcer of other part of left foot with necrosis of bone: Secondary | ICD-10-CM

## 2023-11-14 NOTE — Progress Notes (Signed)
  Subjective:  Patient ID: Kyle [name], male    DOB: 1960-08-02,  MRN: [id number]  Date of surgery 1425 Surgery: Left third and right 2nd and 3rd toe amputations.  Patient presenting for office follow-up after above procedures.  He has left the dressings clean dry and intact until today as instructed.  Walking in bilateral postop shoes.  He denies pain.  He does report there was some bleeding from the left foot but has since stopped.   Negative for chest pain and shortness of breath Fever: no Night sweats: no Review of all other systems is negative Objective:   There were no vitals filed for this visit.  General AA&O x3. Normal mood and affect.  Vascular Dorsalis pedis and posterior tibial pulses 2/4 bilat. Brisk capillary refill to all digits. Pedal hair present.  Neurologic Epicritic sensation grossly absent.  Dermatologic Amputation sites healing well bilaterally, there is mild dehiscence of the second toe amputation site on the left foot however there is no drainage or deep probing wound some granular tissue present at the wound base.  Orthopedic: MMT 5/5 in dorsiflexion, plantarflexion, inversion, and eversion. Normal joint ROM without pain or crepitus.    Assessment & Plan:  Patient was evaluated and treated and all questions answered.  1 week status post left third and right 2nd and 3rd toe amputations -Overall progressing well status post above procedure, superficial dehiscence of left second toe amputation site, this appears to be overall healing and not infected at this time - WBAT in surgical shoe bilateral.    - Finish course of postoperative antibiotics and monitor off  - Dressing changed to both feet with Xeroform and Betadine dressing.  Leave intact until next appointment will consider suture removal at next appointment  Evertt Hoe, DPM

## 2023-11-19 ENCOUNTER — Encounter (INDEPENDENT_AMBULATORY_CARE_PROVIDER_SITE_OTHER): Admitting: Ophthalmology

## 2023-11-19 DIAGNOSIS — I10 Essential (primary) hypertension: Secondary | ICD-10-CM

## 2023-11-19 DIAGNOSIS — Z7985 Long-term (current) use of injectable non-insulin antidiabetic drugs: Secondary | ICD-10-CM | POA: Diagnosis not present

## 2023-11-19 DIAGNOSIS — H35033 Hypertensive retinopathy, bilateral: Secondary | ICD-10-CM

## 2023-11-19 DIAGNOSIS — E113511 Type 2 diabetes mellitus with proliferative diabetic retinopathy with macular edema, right eye: Secondary | ICD-10-CM

## 2023-11-19 DIAGNOSIS — H43813 Vitreous degeneration, bilateral: Secondary | ICD-10-CM

## 2023-11-19 DIAGNOSIS — E113392 Type 2 diabetes mellitus with moderate nonproliferative diabetic retinopathy without macular edema, left eye: Secondary | ICD-10-CM

## 2023-11-21 ENCOUNTER — Ambulatory Visit: Admitting: Podiatry

## 2023-11-21 DIAGNOSIS — L97524 Non-pressure chronic ulcer of other part of left foot with necrosis of bone: Secondary | ICD-10-CM

## 2023-11-21 DIAGNOSIS — L97514 Non-pressure chronic ulcer of other part of right foot with necrosis of bone: Secondary | ICD-10-CM

## 2023-11-21 DIAGNOSIS — E08621 Diabetes mellitus due to underlying condition with foot ulcer: Secondary | ICD-10-CM | POA: Diagnosis not present

## 2023-11-21 NOTE — Progress Notes (Signed)
  Subjective:  Patient ID: Kyle [name], male    DOB: 1960/12/23,  MRN: [id number]  Date of surgery 1425 Surgery: Left third and right 2nd and 3rd toe amputations.  Patient presenting for office follow-up after above procedures.  He reports he is doing well he has been doing wound care with dressing change to both feet.  Denies any pain.  Denies significant drainage   Negative for chest pain and shortness of breath Fever: no Night sweats: no Review of all other systems is negative Objective:   There were no vitals filed for this visit.  General AA&O x3. Normal mood and affect.  Vascular Dorsalis pedis and posterior tibial pulses 2/4 bilat. Brisk capillary refill to all digits. Pedal hair present.  Neurologic Epicritic sensation grossly absent.  Dermatologic Amputation sites healing well bilaterally, there is mild superficial dehiscence of the second toe amputation site on the left foot however there is no drainage or deep probing wound some granular tissue present at the wound base.  Overall improved from prior.  Right foot is nearly fully healed  Orthopedic: MMT 5/5 in dorsiflexion, plantarflexion, inversion, and eversion. Normal joint ROM without pain or crepitus.    Assessment & Plan:  Patient was evaluated and treated and all questions answered.  2 week status post left third and right 2nd and 3rd toe amputations -Overall progressing well status post above procedure, superficial dehiscence of left second toe amputation site, this appears to be overall healing and not infected at this time -Sutures removed in total at this visit from both feet - WBAT in surgical shoe bilateral.    - Monitor off antibiotics - Betadine applied after debridement of any superficial necrotic tissue overlying the applied Band-Aid dressings.  Recommend the patient continue with daily Betadine and Band-Aid dressing changes to both foot amputation sites - Follow-up in 2 weeks for wound  check  Evertt Hoe, DPM

## 2023-12-05 ENCOUNTER — Ambulatory Visit (INDEPENDENT_AMBULATORY_CARE_PROVIDER_SITE_OTHER): Admitting: Podiatry

## 2023-12-05 ENCOUNTER — Encounter: Payer: Self-pay | Admitting: Podiatry

## 2023-12-05 DIAGNOSIS — E08621 Diabetes mellitus due to underlying condition with foot ulcer: Secondary | ICD-10-CM

## 2023-12-05 DIAGNOSIS — L97524 Non-pressure chronic ulcer of other part of left foot with necrosis of bone: Secondary | ICD-10-CM | POA: Diagnosis not present

## 2023-12-05 DIAGNOSIS — L97514 Non-pressure chronic ulcer of other part of right foot with necrosis of bone: Secondary | ICD-10-CM

## 2023-12-05 NOTE — Progress Notes (Signed)
  Subjective:  Patient ID: Kyle [name], male    DOB: 17-May-1961,  MRN: [id number]  Date of surgery 11/08/23 Surgery: Right third and left 2nd and 3rd toe amputations.  Patient presenting for office follow-up after above procedures.  He reports he is doing well he has been doing wound care with dressing change to both feet.  The right foot amputation site is fully healed no concerns.  The left foot is improving with wound care including Betadine paint and Band-Aid change daily.  He has not seen much drainage   Negative for chest pain and shortness of breath Fever: no Night sweats: no Review of all other systems is negative Objective:   There were no vitals filed for this visit.  General AA&O x3. Normal mood and affect.  Vascular Dorsalis pedis and posterior tibial pulses 2/4 bilat. Brisk capillary refill to all digits. Pedal hair present.  Neurologic Epicritic sensation grossly absent.  Dermatologic Amputation sites healing well bilaterally, there is mild superficial dehiscence of the second toe amputation site on the left foot however there is no drainage or deep probing wound some granular tissue present at the wound base. -Improved from prior and the third toe amputation site in the left foot appears fully healed at this point   Right foot third toe amputation site fully healed   Orthopedic: MMT 5/5 in dorsiflexion, plantarflexion, inversion, and eversion. Normal joint ROM without pain or crepitus.    Assessment & Plan:  Patient was evaluated and treated and all questions answered.  4 week status post left third and right 2nd and 3rd toe amputations -Overall progressing well status post above procedure, superficial dehiscence of left second toe amputation site, this appears to be overall healing nearly fully healed -Continue Betadine paint and adhesive bandage dressing to left foot -Right third toe amputation fully healed -Sutures previously removed - WBAT in surgical shoe  to the left foot okay to go back to regular shoe gear on the right side - For antibiotics - Betadine applied after debridement of any superficial necrotic tissue overlying the applied Band-Aid dressings.  Recommend the patient continue with daily Betadine and Band-Aid dressing changes to left second toe amputation site - Follow-up in 3 weeks for wound check  Evertt Hoe, DPM

## 2023-12-23 ENCOUNTER — Encounter (INDEPENDENT_AMBULATORY_CARE_PROVIDER_SITE_OTHER): Admitting: Ophthalmology

## 2023-12-23 DIAGNOSIS — E113392 Type 2 diabetes mellitus with moderate nonproliferative diabetic retinopathy without macular edema, left eye: Secondary | ICD-10-CM | POA: Diagnosis not present

## 2023-12-23 DIAGNOSIS — I10 Essential (primary) hypertension: Secondary | ICD-10-CM | POA: Diagnosis not present

## 2023-12-23 DIAGNOSIS — E113511 Type 2 diabetes mellitus with proliferative diabetic retinopathy with macular edema, right eye: Secondary | ICD-10-CM | POA: Diagnosis not present

## 2023-12-23 DIAGNOSIS — Z7985 Long-term (current) use of injectable non-insulin antidiabetic drugs: Secondary | ICD-10-CM

## 2023-12-23 DIAGNOSIS — H43813 Vitreous degeneration, bilateral: Secondary | ICD-10-CM

## 2023-12-23 DIAGNOSIS — H35033 Hypertensive retinopathy, bilateral: Secondary | ICD-10-CM

## 2023-12-26 ENCOUNTER — Encounter: Admitting: Podiatry

## 2023-12-26 ENCOUNTER — Ambulatory Visit (INDEPENDENT_AMBULATORY_CARE_PROVIDER_SITE_OTHER): Admitting: Podiatry

## 2023-12-26 DIAGNOSIS — Z9889 Other specified postprocedural states: Secondary | ICD-10-CM

## 2023-12-26 DIAGNOSIS — L97524 Non-pressure chronic ulcer of other part of left foot with necrosis of bone: Secondary | ICD-10-CM | POA: Diagnosis not present

## 2023-12-26 DIAGNOSIS — E08621 Diabetes mellitus due to underlying condition with foot ulcer: Secondary | ICD-10-CM | POA: Diagnosis not present

## 2023-12-26 NOTE — Progress Notes (Signed)
  Subjective:  Patient ID: Kyle [name], male    DOB: October 06, 1960,  MRN: [id number]  Date of surgery 11/08/23 Surgery: Right third and left 2nd and 3rd toe amputations.  Patient presenting for office follow-up after above procedures approximately 7 weeks from surgery.  He reports he is doing well he has been doing wound care with dressing change to both feet.   Has been keeping the left foot dry.  No concerns and feels the wounds are healing well.   Negative for chest pain and shortness of breath Fever: no Night sweats: no Review of all other systems is negative Objective:   There were no vitals filed for this visit.  General AA&O x3. Normal mood and affect.  Vascular Dorsalis pedis and posterior tibial pulses 2/4 bilat. Brisk capillary refill to all digits. Pedal hair present.  Neurologic Epicritic sensation grossly absent.  Dermatologic Amputation site on the left foot is nearly fully healed.  There is a very small superficial ulcer 0.2 x 0.2 cm under the callus/eschar tissue that was debrided along the amputation site today    Right foot third toe amputation site fully healed   Orthopedic: MMT 5/5 in dorsiflexion, plantarflexion, inversion, and eversion. Normal joint ROM without pain or crepitus.    Assessment & Plan:  Patient was evaluated and treated and all questions answered.  7 week status post left third and right 2nd and 3rd toe amputations -Overall nearly fully healed bilaterally.  Right is fully healed and left has a very small superficial ulceration that was debrided today with tissue nipper to healthy base no evidence of infection that will heal within the next 1 to 2 weeks -Continue placing adhesive bandage over the left foot superficial ulceration until fully healed with antibiotic ointment as needed -Right third toe amputation fully healed -Sutures previously removed - WBAT in regular shoes - No antibiotics indicated - Applied adhesive Band-Aid style  dressing to left foot until fully healed with antibiotic ointment on the wound as needed - Follow-up as needed if the left foot wound fails to heal within 1 to 2 weeks or any issues arise  Evertt Hoe, DPM

## 2024-01-27 ENCOUNTER — Encounter (INDEPENDENT_AMBULATORY_CARE_PROVIDER_SITE_OTHER): Admitting: Ophthalmology

## 2024-01-27 DIAGNOSIS — Z7985 Long-term (current) use of injectable non-insulin antidiabetic drugs: Secondary | ICD-10-CM

## 2024-01-27 DIAGNOSIS — H35033 Hypertensive retinopathy, bilateral: Secondary | ICD-10-CM

## 2024-01-27 DIAGNOSIS — E113392 Type 2 diabetes mellitus with moderate nonproliferative diabetic retinopathy without macular edema, left eye: Secondary | ICD-10-CM | POA: Diagnosis not present

## 2024-01-27 DIAGNOSIS — I10 Essential (primary) hypertension: Secondary | ICD-10-CM

## 2024-01-27 DIAGNOSIS — H43813 Vitreous degeneration, bilateral: Secondary | ICD-10-CM

## 2024-01-27 DIAGNOSIS — E113511 Type 2 diabetes mellitus with proliferative diabetic retinopathy with macular edema, right eye: Secondary | ICD-10-CM

## 2024-03-02 ENCOUNTER — Encounter (INDEPENDENT_AMBULATORY_CARE_PROVIDER_SITE_OTHER): Admitting: Ophthalmology

## 2024-03-02 DIAGNOSIS — E113292 Type 2 diabetes mellitus with mild nonproliferative diabetic retinopathy without macular edema, left eye: Secondary | ICD-10-CM

## 2024-03-02 DIAGNOSIS — E113511 Type 2 diabetes mellitus with proliferative diabetic retinopathy with macular edema, right eye: Secondary | ICD-10-CM | POA: Diagnosis not present

## 2024-03-02 DIAGNOSIS — Z7985 Long-term (current) use of injectable non-insulin antidiabetic drugs: Secondary | ICD-10-CM | POA: Diagnosis not present

## 2024-03-02 DIAGNOSIS — H35033 Hypertensive retinopathy, bilateral: Secondary | ICD-10-CM

## 2024-03-02 DIAGNOSIS — I10 Essential (primary) hypertension: Secondary | ICD-10-CM

## 2024-04-06 ENCOUNTER — Encounter (INDEPENDENT_AMBULATORY_CARE_PROVIDER_SITE_OTHER): Admitting: Ophthalmology

## 2024-04-06 DIAGNOSIS — H35033 Hypertensive retinopathy, bilateral: Secondary | ICD-10-CM

## 2024-04-06 DIAGNOSIS — Z7985 Long-term (current) use of injectable non-insulin antidiabetic drugs: Secondary | ICD-10-CM | POA: Diagnosis not present

## 2024-04-06 DIAGNOSIS — E113392 Type 2 diabetes mellitus with moderate nonproliferative diabetic retinopathy without macular edema, left eye: Secondary | ICD-10-CM

## 2024-04-06 DIAGNOSIS — I10 Essential (primary) hypertension: Secondary | ICD-10-CM

## 2024-04-06 DIAGNOSIS — E113511 Type 2 diabetes mellitus with proliferative diabetic retinopathy with macular edema, right eye: Secondary | ICD-10-CM

## 2024-04-06 DIAGNOSIS — H43813 Vitreous degeneration, bilateral: Secondary | ICD-10-CM

## 2024-05-11 ENCOUNTER — Encounter (INDEPENDENT_AMBULATORY_CARE_PROVIDER_SITE_OTHER): Admitting: Ophthalmology

## 2024-05-11 DIAGNOSIS — I10 Essential (primary) hypertension: Secondary | ICD-10-CM

## 2024-05-11 DIAGNOSIS — E113511 Type 2 diabetes mellitus with proliferative diabetic retinopathy with macular edema, right eye: Secondary | ICD-10-CM

## 2024-05-11 DIAGNOSIS — Z7985 Long-term (current) use of injectable non-insulin antidiabetic drugs: Secondary | ICD-10-CM

## 2024-05-11 DIAGNOSIS — H43813 Vitreous degeneration, bilateral: Secondary | ICD-10-CM

## 2024-05-11 DIAGNOSIS — E113392 Type 2 diabetes mellitus with moderate nonproliferative diabetic retinopathy without macular edema, left eye: Secondary | ICD-10-CM | POA: Diagnosis not present

## 2024-05-11 DIAGNOSIS — H35033 Hypertensive retinopathy, bilateral: Secondary | ICD-10-CM

## 2024-06-15 ENCOUNTER — Encounter (INDEPENDENT_AMBULATORY_CARE_PROVIDER_SITE_OTHER): Admitting: Ophthalmology

## 2024-06-15 DIAGNOSIS — H43813 Vitreous degeneration, bilateral: Secondary | ICD-10-CM

## 2024-06-15 DIAGNOSIS — Z7985 Long-term (current) use of injectable non-insulin antidiabetic drugs: Secondary | ICD-10-CM | POA: Diagnosis not present

## 2024-06-15 DIAGNOSIS — I10 Essential (primary) hypertension: Secondary | ICD-10-CM | POA: Diagnosis not present

## 2024-06-15 DIAGNOSIS — H35033 Hypertensive retinopathy, bilateral: Secondary | ICD-10-CM

## 2024-06-15 DIAGNOSIS — E113392 Type 2 diabetes mellitus with moderate nonproliferative diabetic retinopathy without macular edema, left eye: Secondary | ICD-10-CM

## 2024-06-15 DIAGNOSIS — E113511 Type 2 diabetes mellitus with proliferative diabetic retinopathy with macular edema, right eye: Secondary | ICD-10-CM

## 2024-07-27 ENCOUNTER — Encounter (INDEPENDENT_AMBULATORY_CARE_PROVIDER_SITE_OTHER): Admitting: Ophthalmology

## 2024-07-27 DIAGNOSIS — H43813 Vitreous degeneration, bilateral: Secondary | ICD-10-CM

## 2024-07-27 DIAGNOSIS — H35033 Hypertensive retinopathy, bilateral: Secondary | ICD-10-CM | POA: Diagnosis not present

## 2024-07-27 DIAGNOSIS — E113312 Type 2 diabetes mellitus with moderate nonproliferative diabetic retinopathy with macular edema, left eye: Secondary | ICD-10-CM

## 2024-07-27 DIAGNOSIS — I10 Essential (primary) hypertension: Secondary | ICD-10-CM | POA: Diagnosis not present

## 2024-07-27 DIAGNOSIS — Z7985 Long-term (current) use of injectable non-insulin antidiabetic drugs: Secondary | ICD-10-CM | POA: Diagnosis not present

## 2024-07-27 DIAGNOSIS — E113511 Type 2 diabetes mellitus with proliferative diabetic retinopathy with macular edema, right eye: Secondary | ICD-10-CM | POA: Diagnosis not present

## 2024-08-05 ENCOUNTER — Ambulatory Visit (INDEPENDENT_AMBULATORY_CARE_PROVIDER_SITE_OTHER)

## 2024-08-05 ENCOUNTER — Encounter: Payer: Self-pay | Admitting: Podiatry

## 2024-08-05 ENCOUNTER — Ambulatory Visit: Admitting: Podiatry

## 2024-08-05 DIAGNOSIS — M2041 Other hammer toe(s) (acquired), right foot: Secondary | ICD-10-CM

## 2024-08-05 DIAGNOSIS — L97512 Non-pressure chronic ulcer of other part of right foot with fat layer exposed: Secondary | ICD-10-CM

## 2024-08-05 DIAGNOSIS — L97511 Non-pressure chronic ulcer of other part of right foot limited to breakdown of skin: Secondary | ICD-10-CM

## 2024-08-05 MED ORDER — MUPIROCIN 2 % EX OINT: 1.0000 | TOPICAL_OINTMENT | Freq: Two times a day (BID) | CUTANEOUS | 0 refills | Status: AC

## 2024-08-05 NOTE — Progress Notes (Signed)
 HPI: Patient presents for new ulcer on the distal aspect of the second toe right.  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  Has been ulcerated for about 2 or 3 months.  He has not noticed any purulence or signs of infection.  Mostly says been clear to yellowish drainage.  Recall any injury to the foot.    Denies N/V/F/Ch.   Objective:   Vascular:  DP pulses 2/4 bilaterally. PT pulses 2/4 bilaterally.  Capillary refill time immediate bilaterally.  Mild edema lower extremity bilaterally   Neurologic: Decreased protective sensation feet bilaterally  Dermatologic: Full-thickness ulceration distal aspect second toe right. Wound base granulation tissue and devitalized tissue. .  Moderate serous exudate.  Purulent drainage not present. Predebridement ulcer measures 3 mm x 6 mm x 3 deep.  Musculoskeletal: Status post amputation third toe right.  Severe hammertoe deformity 2nd and 3rd toes right.  Radiographs:  3 views foot right: Status post amputation third toe right.  Hammertoe deformities toes 2 and 3.  No evidence of osteomyelitis in the second toe.  Normal cortical margins with no radiolucencies noted.  No subcutaneous emphysema noted.   Assessment:  Full-thickness Wagner grade 2 ulceration distal aspect second toe right 2.  Hammertoe deformity second toe right.  Plan:  Patient was evaluated and treated and all questions answered.  Ulcer full-thickness into the subcutaneous tissue distal aspect second toe right -Ulcer progress: New ulcer distal aspect second toe right -Debridement as below. -Dressed with antibiotic ointment and DSD. - Wear   surgical shoe left for offloading .SABRA  Has a shoe already. -Wound care: Soak twice daily luke warm epsom salt water 15 min . Apply Bactroban  ointment and dressing. -Rx Bactroban  ointment, apply twice daily to wound, 2 refills  Procedure: Excisional Debridement of Wound Tool:  Sharp #312 chisel blade/tissue nipper Type of Debridement: Sharp Excisional Frequency: Every 1 to 2 weeks until appropriately healed.   Rationale: Removal of non-viable soft tissue from the wound to promote healing.  Anesthesia: none  Pre-Debridement Wound Measurements: 3 mm x next mm x 3 8 mm deep Post-Debridement Wound Measurements: 6 mm x 9 mm x 4 mm deep Area devitalized tissue removed(nonviable tissue only): This mm x 9 mm.  Blood loss: Minimal (<50cc) Depth of Debridement: Full-thickness into subcutaneous tissue  Description of tissue removed: Devitalized tissue Technique: Using #312 blade/tissue nipper sharp debridement of necrotic/nonviable tissue was performed until healthy bleeding wound bed at base of wound was achieved.  After debridement no nonviable was present in wound.  No underlying bone or tendon was exposed during debridement.  The wound was thoroughly irrigated with normal saline solution Dressing: Dry, sterile, compression dressing. Disposition: Patient tolerated procedure well.   Return 1 week weeks f/u ulcer second toe right

## 2024-08-12 ENCOUNTER — Encounter: Payer: Self-pay | Admitting: Podiatry

## 2024-08-12 ENCOUNTER — Ambulatory Visit: Admitting: Podiatry

## 2024-08-12 DIAGNOSIS — L97512 Non-pressure chronic ulcer of other part of right foot with fat layer exposed: Secondary | ICD-10-CM | POA: Diagnosis not present

## 2024-08-12 NOTE — Progress Notes (Signed)
 HPI: Patient presents for ulcer follow-up.  Ulcer distal aspect second toe.  Says it seems to be looking better not getting wet as much drainage just some yellow clear drainage from it.  Has not noticed any odors.  Says the swelling and redness has come down.    Denies N/V/F/Ch.   Objective:   Vascular:  DP pulses 2/4 bilaterally. PT pulses 2/4 bilaterally.  Capillary refill time immediate bilaterally.   Neurologic: Decreased protective sensation feet bilaterally  Dermatologic: Full-thickness ulceration penetrating subcutaneous tissue distal aspect second toe right.. Wound base mixed granulation and devitalized tissue..  Moderate clear exudate.  Purulent drainage not present. Predebridement ulcer measures 3 mm x 2 to mm x 2 to mm deep.  No signs of infection.  Musculoskeletal:    Assessment:  Full-thickness Wagner grade 2 ulceration distal aspect second toe right.  Plan:  Patient was evaluated and treated and all questions answered.  Ulcer distal aspect second toe right. -Ulcer progress: Ulcer improved with no signs of infection -Debridement as below. -Dressed with antibiotic ointment and DSD. - Continue   surgical shoe for offloading .. -Wound care: Soak twice daily luke warm epsom salt water 15 min . Apply back Tr[employer] ointment and dressing.  Procedure: Excisional Debridement of Wound Tool: Sharp #312 chisel blade/tissue nipper Type of Debridement: Sharp Excisional Frequency: Every 1 to 2 weeks until appropriately healed.   Rationale: Removal of non-viable soft tissue from the wound to promote healing.  Anesthesia: none  Pre-Debridement Wound Measurements: 3 mm x 2 mm x 2 mm deep Post-Debridement Wound Measurements: 5 mm x 4 mm x 3 mm deep Area devitalized tissue removed(nonviable tissue only): 5 mm x 4 mm.  Blood loss: Minimal (<50cc) Depth of Debridement: Full-thickness into subcutaneous tissue  Description of tissue removed: Devitalized tissue Technique: Using  #312 blade/tissue nipper sharp debridement of necrotic/nonviable tissue was performed until healthy bleeding wound bed at base of wound was achieved.  After debridement no nonviable was present in wound.  No underlying bone or tendon was exposed during debridement.  The wound was thoroughly irrigated with normal saline solution Dressing: Dry, sterile, compression dressing. Disposition: Patient tolerated procedure well.   Return 2 weeks weeks f/u ulcer

## 2024-08-26 ENCOUNTER — Ambulatory Visit: Admitting: Podiatry

## 2024-08-26 DIAGNOSIS — L97501 Non-pressure chronic ulcer of other part of unspecified foot limited to breakdown of skin: Secondary | ICD-10-CM

## 2024-08-26 NOTE — Progress Notes (Signed)
 Patient presents follow-up ulceration second toe right.  Has been soaking twice a day and putting Bactroban  ointment on it.  Says been irregularly using a surgical shoe because of the snow and ice and around.  No fever chills nausea or vomiting.  Physical Exam:  Patient alert and oriented x 3.  No complaints of nausea, vomiting, fever, or chills  Vascular: DP pulses 2/4 bilateral. PT pulses 2/4 lateral.  Mild edema lower extremity. Capillary fill time immediate bilaterally.  Dermatologic: Superficial ulcer distal aspect second toe right.  Penetrates into the dermis.  No subcutaneous penetration.  Good base of granulation tissue.. Measures 4  mm wide x 3 mm long x 1 mm deep.   Neurologic:   Musculoskeletal: Hammertoe second to right.  Status post amputation third toe right   Diagnoses: 1.  Superficial ulceration Wagner grade 1 second toe right foot.  Plan: - Established office visit for evaluation and management level 3. - Discussed and the ulcer which is improving has gone from a deep ulceration was superficial with good healing base.  Continue wound care soaking in twice daily warm Epsom salt water applying Bactroban  ointment and a light dressing. -Discussed with them the nail is probably contributing to this in some part.  Once we get the ulcer healed probably will consider a matrixectomy on the second toe right -Sharp debridement with tissue nippers superficial Wagner grade 1 ulceration second toe right.  Debrided any devitalized tissue with a tissue nipper.  Applied antibiotic ointment and a light dressing    Return 2 weeks f/u ulcer second toe right

## 2024-09-07 ENCOUNTER — Encounter (INDEPENDENT_AMBULATORY_CARE_PROVIDER_SITE_OTHER): Admitting: Ophthalmology

## 2024-09-09 ENCOUNTER — Ambulatory Visit: Admitting: Podiatry
